# Patient Record
Sex: Female | Born: 1975 | Hispanic: Yes | Marital: Married | Smoking: Never smoker
Health system: Southern US, Community
[De-identification: ages and names within clinical notes are randomized; demographics above are authoritative.]

## PROBLEM LIST (undated history)

## (undated) DIAGNOSIS — J45909 Unspecified asthma, uncomplicated: Secondary | ICD-10-CM

## (undated) DIAGNOSIS — M359 Systemic involvement of connective tissue, unspecified: Secondary | ICD-10-CM

## (undated) DIAGNOSIS — K219 Gastro-esophageal reflux disease without esophagitis: Secondary | ICD-10-CM

## (undated) DIAGNOSIS — K589 Irritable bowel syndrome without diarrhea: Secondary | ICD-10-CM

## (undated) DIAGNOSIS — M5090 Cervical disc disorder, unspecified, unspecified cervical region: Secondary | ICD-10-CM

## (undated) DIAGNOSIS — IMO0001 Reserved for inherently not codable concepts without codable children: Secondary | ICD-10-CM

## (undated) DIAGNOSIS — Z8639 Personal history of other endocrine, nutritional and metabolic disease: Secondary | ICD-10-CM

## (undated) HISTORY — PX: SHOULDER SURGERY: SHX246

## (undated) HISTORY — DX: Unspecified asthma, uncomplicated: J45.909

## (undated) HISTORY — DX: Gastro-esophageal reflux disease without esophagitis: K21.9

## (undated) HISTORY — DX: Cervical disc disorder, unspecified, unspecified cervical region: M50.90

## (undated) HISTORY — DX: Irritable bowel syndrome, unspecified: K58.9

## (undated) HISTORY — PX: SPINE SURGERY: SHX786

## (undated) HISTORY — DX: Personal history of other endocrine, nutritional and metabolic disease: Z86.39

## (undated) HISTORY — DX: Systemic involvement of connective tissue, unspecified: M35.9

## (undated) HISTORY — DX: Reserved for inherently not codable concepts without codable children: IMO0001

---

## 2000-09-23 ENCOUNTER — Other Ambulatory Visit: Admission: RE | Admit: 2000-09-23 | Discharge: 2000-09-23 | Payer: Self-pay | Admitting: Gynecology

## 2001-09-28 ENCOUNTER — Other Ambulatory Visit: Admission: RE | Admit: 2001-09-28 | Discharge: 2001-09-28 | Payer: Self-pay | Admitting: Gynecology

## 2002-10-12 ENCOUNTER — Other Ambulatory Visit: Admission: RE | Admit: 2002-10-12 | Discharge: 2002-10-12 | Payer: Self-pay | Admitting: Gynecology

## 2002-11-26 ENCOUNTER — Ambulatory Visit (HOSPITAL_COMMUNITY): Admission: RE | Admit: 2002-11-26 | Discharge: 2002-11-26 | Payer: Self-pay | Admitting: Gynecology

## 2003-02-25 ENCOUNTER — Observation Stay (HOSPITAL_COMMUNITY): Admission: AD | Admit: 2003-02-25 | Discharge: 2003-02-26 | Payer: Self-pay | Admitting: Gynecology

## 2003-03-21 ENCOUNTER — Other Ambulatory Visit: Admission: RE | Admit: 2003-03-21 | Discharge: 2003-03-21 | Payer: Self-pay | Admitting: Gynecology

## 2003-07-07 ENCOUNTER — Inpatient Hospital Stay (HOSPITAL_COMMUNITY): Admission: AD | Admit: 2003-07-07 | Discharge: 2003-07-07 | Payer: Self-pay | Admitting: Gynecology

## 2003-08-20 ENCOUNTER — Encounter: Admission: RE | Admit: 2003-08-20 | Discharge: 2003-08-20 | Payer: Self-pay | Admitting: Gynecology

## 2003-08-27 ENCOUNTER — Inpatient Hospital Stay (HOSPITAL_COMMUNITY): Admission: AD | Admit: 2003-08-27 | Discharge: 2003-08-31 | Payer: Self-pay | Admitting: Gynecology

## 2003-08-28 ENCOUNTER — Encounter (INDEPENDENT_AMBULATORY_CARE_PROVIDER_SITE_OTHER): Payer: Self-pay | Admitting: *Deleted

## 2003-10-10 ENCOUNTER — Other Ambulatory Visit: Admission: RE | Admit: 2003-10-10 | Discharge: 2003-10-10 | Payer: Self-pay | Admitting: Gynecology

## 2004-12-10 ENCOUNTER — Other Ambulatory Visit: Admission: RE | Admit: 2004-12-10 | Discharge: 2004-12-10 | Payer: Self-pay | Admitting: Gynecology

## 2004-12-18 ENCOUNTER — Encounter: Admission: RE | Admit: 2004-12-18 | Discharge: 2004-12-18 | Payer: Self-pay | Admitting: Gynecology

## 2006-06-28 ENCOUNTER — Other Ambulatory Visit: Admission: RE | Admit: 2006-06-28 | Discharge: 2006-06-28 | Payer: Self-pay | Admitting: Gynecology

## 2007-06-07 ENCOUNTER — Emergency Department (HOSPITAL_COMMUNITY): Admission: EM | Admit: 2007-06-07 | Discharge: 2007-06-07 | Payer: Self-pay | Admitting: Emergency Medicine

## 2007-12-26 ENCOUNTER — Encounter: Admission: RE | Admit: 2007-12-26 | Discharge: 2007-12-26 | Payer: Self-pay | Admitting: Internal Medicine

## 2008-07-31 ENCOUNTER — Encounter: Payer: Self-pay | Admitting: Gynecology

## 2008-07-31 ENCOUNTER — Other Ambulatory Visit: Admission: RE | Admit: 2008-07-31 | Discharge: 2008-07-31 | Payer: Self-pay | Admitting: Gynecology

## 2008-07-31 ENCOUNTER — Ambulatory Visit: Payer: Self-pay | Admitting: Gynecology

## 2008-10-15 ENCOUNTER — Emergency Department (HOSPITAL_COMMUNITY): Admission: EM | Admit: 2008-10-15 | Discharge: 2008-10-16 | Payer: Self-pay | Admitting: Emergency Medicine

## 2009-08-22 ENCOUNTER — Other Ambulatory Visit: Admission: RE | Admit: 2009-08-22 | Discharge: 2009-08-22 | Payer: Self-pay | Admitting: Gynecology

## 2009-08-22 ENCOUNTER — Ambulatory Visit: Payer: Self-pay | Admitting: Gynecology

## 2010-02-24 ENCOUNTER — Emergency Department (HOSPITAL_COMMUNITY)
Admission: EM | Admit: 2010-02-24 | Discharge: 2010-02-24 | Disposition: A | Discharge status: Home / Self Care | Payer: BC Managed Care – PPO | Attending: Emergency Medicine | Admitting: Emergency Medicine

## 2010-02-24 DIAGNOSIS — E162 Hypoglycemia, unspecified: Secondary | ICD-10-CM | POA: Insufficient documentation

## 2010-02-24 DIAGNOSIS — Z79899 Other long term (current) drug therapy: Secondary | ICD-10-CM | POA: Insufficient documentation

## 2010-02-24 DIAGNOSIS — M2550 Pain in unspecified joint: Secondary | ICD-10-CM | POA: Insufficient documentation

## 2010-02-24 DIAGNOSIS — J45909 Unspecified asthma, uncomplicated: Secondary | ICD-10-CM | POA: Insufficient documentation

## 2010-02-24 DIAGNOSIS — K589 Irritable bowel syndrome without diarrhea: Secondary | ICD-10-CM | POA: Insufficient documentation

## 2010-02-24 LAB — GLUCOSE, CAPILLARY: Glucose-Capillary: 105 mg/dL — ABNORMAL HIGH (ref 70–99)

## 2010-03-04 ENCOUNTER — Ambulatory Visit: Payer: BC Managed Care – PPO | Admitting: Gynecology

## 2010-03-04 ENCOUNTER — Other Ambulatory Visit: Payer: BC Managed Care – PPO

## 2010-04-02 ENCOUNTER — Other Ambulatory Visit: Payer: Self-pay | Admitting: Gynecology

## 2010-04-02 ENCOUNTER — Other Ambulatory Visit: Payer: BC Managed Care – PPO

## 2010-04-02 ENCOUNTER — Ambulatory Visit (INDEPENDENT_AMBULATORY_CARE_PROVIDER_SITE_OTHER): Payer: BC Managed Care – PPO | Admitting: Gynecology

## 2010-04-02 DIAGNOSIS — N920 Excessive and frequent menstruation with regular cycle: Secondary | ICD-10-CM

## 2010-04-02 DIAGNOSIS — N946 Dysmenorrhea, unspecified: Secondary | ICD-10-CM

## 2010-04-16 LAB — COMPREHENSIVE METABOLIC PANEL
ALT: 48 U/L — ABNORMAL HIGH (ref 0–35)
AST: 38 U/L — ABNORMAL HIGH (ref 0–37)
Albumin: 4 g/dL (ref 3.5–5.2)
Alkaline Phosphatase: 72 U/L (ref 39–117)
BUN: 10 mg/dL (ref 6–23)
CO2: 21 mEq/L (ref 19–32)
Calcium: 9 mg/dL (ref 8.4–10.5)
Chloride: 107 mEq/L (ref 96–112)
Creatinine, Ser: 0.72 mg/dL (ref 0.4–1.2)
GFR calc Af Amer: >60 mL/min (ref >60)
GFR calc non Af Amer: >60 mL/min (ref >60)
Glucose, Bld: 121 mg/dL — ABNORMAL HIGH (ref 70–99)
Potassium: 3.4 mEq/L — ABNORMAL LOW (ref 3.5–5.1)
Sodium: 138 mEq/L (ref 135–145)
Total Bilirubin: 1.1 mg/dL (ref 0.3–1.2)
Total Protein: 7.1 g/dL (ref 6.0–8.3)

## 2010-04-16 LAB — URINALYSIS, ROUTINE W REFLEX MICROSCOPIC
Glucose, UA: NEGATIVE mg/dL
Ketones, ur: 15 mg/dL — AB
Leukocytes, UA: NEGATIVE
Nitrite: NEGATIVE
Protein, ur: NEGATIVE mg/dL
Specific Gravity, Urine: 1.034 — ABNORMAL HIGH (ref 1.005–1.030)
Urobilinogen, UA: 1 mg/dL (ref 0.0–1.0)
pH: 5 (ref 5.0–8.0)

## 2010-04-16 LAB — URINE MICROSCOPIC-ADD ON

## 2010-04-16 LAB — LIPASE, BLOOD: Lipase: 15 U/L (ref 11–59)

## 2010-04-16 LAB — CBC
HCT: 39.9 % (ref 36.0–46.0)
Hemoglobin: 13.5 g/dL (ref 12.0–15.0)
MCHC: 33.9 g/dL (ref 30.0–36.0)
MCV: 81.8 fL (ref 78.0–100.0)
Platelets: 145 10*3/uL — ABNORMAL LOW (ref 150–400)
RBC: 4.88 MIL/uL (ref 3.87–5.11)
RDW: 13 % (ref 11.5–15.5)
WBC: 7.4 10*3/uL (ref 4.0–10.5)

## 2010-04-16 LAB — POCT PREGNANCY, URINE: Preg Test, Ur: NEGATIVE

## 2010-05-29 NOTE — Discharge Summary (Signed)
NAME:  Lori Mercado, Lori Mercado                          ACCOUNT NO.:  [id number]   MEDICAL RECORD NO.:  [id number]                   PATIENT TYPE:  INP   LOCATION:  9306                                 FACILITY:  [facility]   PHYSICIAN:  Ivor Costa. Farrel Gobble, M.D.              DATE OF BIRTH:  Jan 15, 1975   DATE OF ADMISSION:  08/27/2003  DATE OF DISCHARGE:                                 DISCHARGE SUMMARY   PRINCIPAL DIAGNOSIS:  Twin gestation.   ADDITIONAL DIAGNOSIS:  1. Discordance, approximately 40%.  2. Pulmonary edema.  3. Preterm labor.   PRINCIPAL PROCEDURE:  Primary cesarean section.   HOSPITAL COURSE:  The patient was admitted in the evening of August 27, 2003  after calling the on-call physician complaining of shortness of breath.  She  had been admitted to [facility] for contractions August 14  to August 16 at which point she received IV steroids, IV fluid, as well as  subcu and p.o. terbutaline.  She had presented to our office on the  afternoon of August 16 after being discharged and was noted to have an 11-  pound weight gain and was edematous, felt secondary to the IV fluids.  The  patient called later that evening stating that she continued to feel short  of breath with symptoms that were worse than they were earlier in the day.  She was asked to come to [facility] for evaluation.  The patient was  subjectively uncomfortable, she was unable to lie flat.  She had a normal  pulse oximetry but because of her shortness of breath was placed on oxygen.  The patient was also noted to be contracting although not regularly and was  1-2 cm, 70%, and -3 station.  The patient had a chest x-ray performed which  showed pulmonary edema, and because of the above symptoms underwent a  primary cesarean section on the morning of August 17 with delivery of viable  female twins.  Baby A was 3 pounds 5 ounces with Apgars of 6 and 8 and a pH of  7.29.  Baby B was 4 pounds 10 ounces with  Apgars of 8 and 9 and a pH of  7.32.  The uterus, tubes, and ovaries were unremarkable.  The placenta was  sent to pathology.  Her postoperative course, the patient did essentially  well.  She continued to diurese and her shortness of breath slowly over time  improved.  She had restricted IV fluids because of the pulmonary edema that  she had, as well as the general fluid overload.  She was noted to be anemic  postoperatively with a hemoglobin of 6.4 and hematocrit of 18.8 and  platelets of 117.  These were repeated the following day and were noted to  be stable.  PIH labs done upon admission as well as postpartum day #1 were  both normal.  At the point of postpartum  day #3 the patient was ready for  discharge.  Although she felt dizzy felt secondary to Percocet she was able  to ambulate without difficulty, was visiting the children in the NICU, and  she was pumping, producing little milk at this point.  Her postpartum exam  was unremarkable.  Her uterus was firm and nontender.  Her incision was  clean, dry, and intact.  She was discharged home with instructions to resume  her prenatal vitamins and iron and she was given a prescription for Tylox  one to two q.6h. p.r.n. pain #30 which she may mix with over-the-counter  Motrin.  She will follow up in our office 6 weeks postpartum.   DISCHARGE DISPOSITION:  Stable.                                               Ivor Costa. Farrel Gobble, M.D.   Leda Roys  D:  08/31/2003  T:  08/31/2003  Job:  [id number]

## 2010-05-29 NOTE — Op Note (Signed)
NAMECHESNEE, Lori Mercado                            ACCOUNT NO.:  [id number]   MEDICAL RECORD NO.:  [id number]                   PATIENT TYPE:   LOCATION:                                       FACILITY:   PHYSICIAN:  Ivor Costa. Farrel Gobble, M.D.              DATE OF BIRTH:   DATE OF PROCEDURE:  05/28/2003  DATE OF DISCHARGE:                                 OPERATIVE REPORT   PREOPERATIVE DIAGNOSES:  1. 33+ week twins.  2. Approximately 40% discordance.  3. Maternal pulmonary edema.  4. Preterm labor.   POSTOPERATIVE DIAGNOSES:  1. 33+ week twins.  2. Approximately 40% discordance.  3. Maternal pulmonary edema.  4. Preterm labor.   OPERATION PERFORMED:  Primary cesarean section, low flap transverse.   SURGEON:  Ivor Costa. Farrel Gobble, M.D.   ANESTHESIA:  Spinal.   ESTIMATED BLOOD LOSS:  550 mL.   URINE OUTPUT:  100 mL clear.   FINDINGS:  Viable twin males, vertex-vertex with clear amniotic fluid  bilaterally.  Infant A had Apgars had 6 and 8, birth weight of 3 pounds 5  ounces, with pH of 7.29 and was noted to have a hypercoiled cord.  Infant B  had  Apgars of 8 and 9, birth weight 4 pounds, 10 ounces, pH 7.32.  Normal  uterus, tubes and ovaries.   COMPLICATIONS:  None.   PATHOLOGY:  Placenta.   DESCRIPTION OF PROCEDURE:  The patient was taken to the operating room.  Spinal anesthesia was induced.  Placed in dorsal lithotomy position.  Prepped and draped in the usual sterile fashion.  As the patient was  breathing a little more comfortably at this point we did not need to give  her the Lasix that had been ordered preoperatively.  Once adequate  anesthesia was ensured, a Pfannenstiel skin incision was made with a scalpel  and carried to the underlying layer of fascia with electrocautery.  The  fascia was scored in the midline.  Incision was extended laterally with Mayo  scissors.  The inferior aspect of the fascial incision was grasped with  Kochers.  The underlying rectus muscles  were dissected off by blunt and  sharp dissection.  In a similar fashion, the superior aspect of the incision  was grasped with Kochers.  The underlying rectus muscle was dissected off.  The rectus muscles were separated in the midline, the peritoneum was  identified and entered bluntly.  The peritoneal incision was then extended  superiorly and inferiorly.  Good visualization of underlying bowel and  bladder.  The bladder blade was inserted.  The vesicouterine peritoneum was  identified, tented up and entered sharply with Metzenbaum scissors.  The  incision was extended laterally.  Bladder flap was created digitally.  Bladder blade was then reinserted in the lower uterine segment which was  incised in transverse fashion with a scalpel.  We went to the lower edge of  the  placenta.  Baby A, amniotic fluid was noted.  The vertex was held.  The  amniotomy was performed.  However, the vertex floated up and the baby was  then delivered from a double footling breech presentation with the usual  maneuvers.  Cord was cut and clamped and handed off to the awaiting  pediatricians.  Spontaneous cry was noted.  The sac of baby A was then  visualized.  The head was held and amniotomy was performed.  Baby  ____________ were placed and the baby was delivered with the usual  maneuvers.  The cord was cut and clamped and again, spontaneous respirations  were noted.  PH's were obtained on both placentas.  Cord bloods were also  obtained.  The uterus was massaged and the placentas were separated.  The  uterus was then cleared of all clots and debris.  The uterus was  exteriorized.  As it was noted to be somewhat boggy, the uterine incision  was repaired with a running locked layer of 0 chromic and noted to be  hemostatic.  By the end of the repair of the uterine incision, the bogginess  had resolved.  The uterus was then returned to the pelvis.  The pelvis was  irrigated with copious amounts of warm saline.   Inspection of the lower  segment and incision assured [place] of hemostasis.  The pelvis was cleared of  all clots and debris.  The peritoneum and fascia muscle were noted to be  hemostatic.  The fascia was closed with 0 Vicryl in a running fashion.  The  subcutaneous was irrigated and reapproximated with interrupted 2-0 plain.  The skin was closed with 4-0 Vicryl on the [place].  The patient tolerated the  procedure well.  Sponge, lap, and needle counts were correct times two.  She  received Ancef intraoperatively.                                               Ivor Costa. Farrel Gobble, M.D.    THL/MEDQ  D:  08/28/2003  T:  08/28/2003  Job:  [id number]

## 2010-06-12 HISTORY — PX: ENDOMETRIAL ABLATION: SHX621

## 2010-06-25 ENCOUNTER — Ambulatory Visit (INDEPENDENT_AMBULATORY_CARE_PROVIDER_SITE_OTHER): Payer: BC Managed Care – PPO | Admitting: Gynecology

## 2010-06-25 DIAGNOSIS — Z01818 Encounter for other preprocedural examination: Secondary | ICD-10-CM

## 2010-06-25 DIAGNOSIS — N920 Excessive and frequent menstruation with regular cycle: Secondary | ICD-10-CM

## 2010-06-26 ENCOUNTER — Ambulatory Visit (INDEPENDENT_AMBULATORY_CARE_PROVIDER_SITE_OTHER): Payer: BC Managed Care – PPO | Admitting: Gynecology

## 2010-06-26 DIAGNOSIS — N920 Excessive and frequent menstruation with regular cycle: Secondary | ICD-10-CM

## 2010-07-10 ENCOUNTER — Ambulatory Visit (INDEPENDENT_AMBULATORY_CARE_PROVIDER_SITE_OTHER): Payer: BC Managed Care – PPO | Admitting: Gynecology

## 2010-07-10 DIAGNOSIS — Z9889 Other specified postprocedural states: Secondary | ICD-10-CM

## 2010-07-10 DIAGNOSIS — B3731 Acute candidiasis of vulva and vagina: Secondary | ICD-10-CM

## 2010-07-10 DIAGNOSIS — B373 Candidiasis of vulva and vagina: Secondary | ICD-10-CM

## 2010-07-17 ENCOUNTER — Encounter: Payer: Self-pay | Admitting: *Deleted

## 2010-10-01 IMAGING — CT CT ABDOMEN W/O CM
2 of 4 series · 17 of 46 positions shown, 19 images · non-contrast
Comparison: None available.

CT ABDOMEN

CLINICAL DATA: Hematuria. Nausea and low back pain.

CT ABDOMEN AND PELVIS WITHOUT CONTRAST
TECHNIQUE: Multidetector CT imaging of the abdomen and pelvis was
performed following the standard protocol without intravenous
contrast.

[Series 2: renal stone w/o · axial · non-contrast · 0.66mm/px · z∈[-339,-4]mm · 14 of 73 slices shown, 16 images]
[im 3/73  soft-tissue]
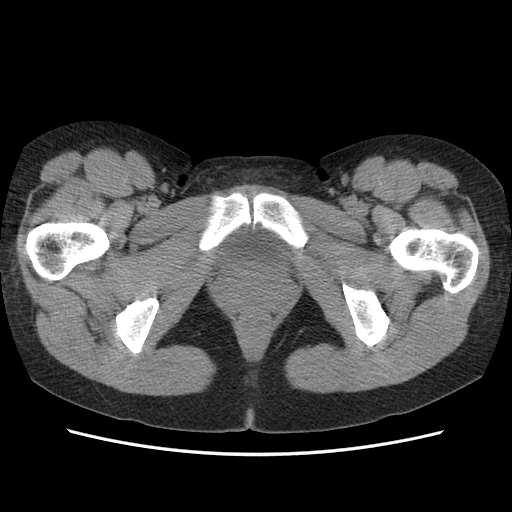
[im 3/73  bone]
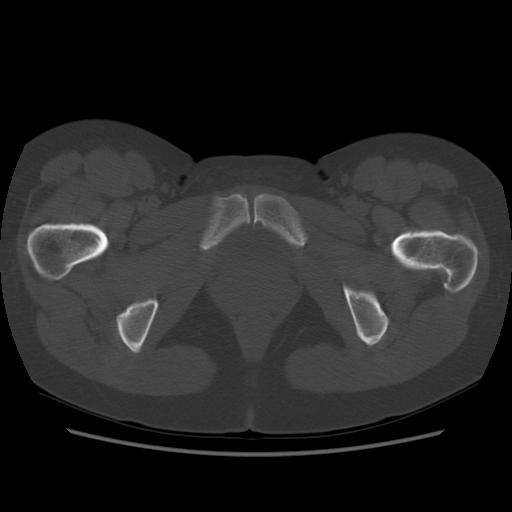
[im 9/73  soft-tissue]
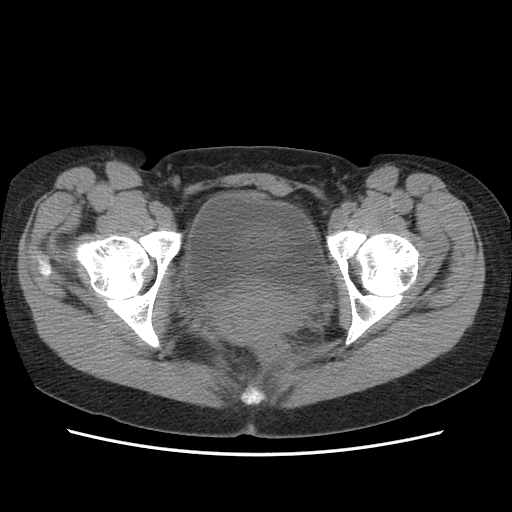
[im 15/73  soft-tissue]
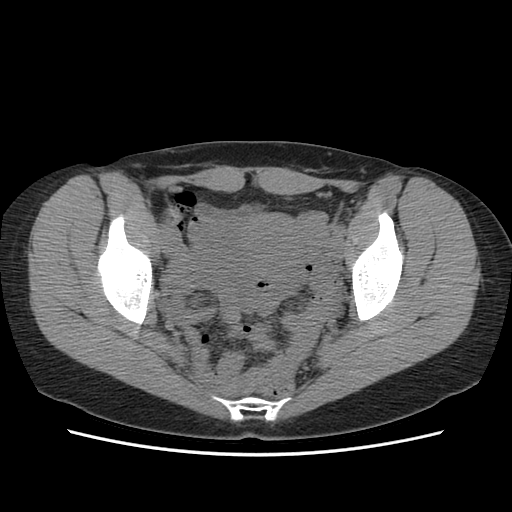
[im 21/73  soft-tissue]
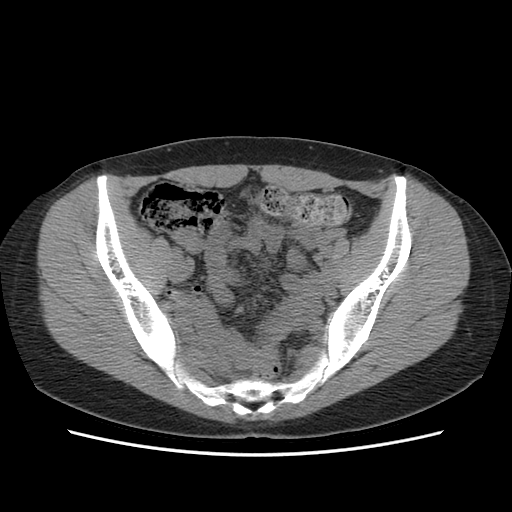
[im 24/73  soft-tissue]
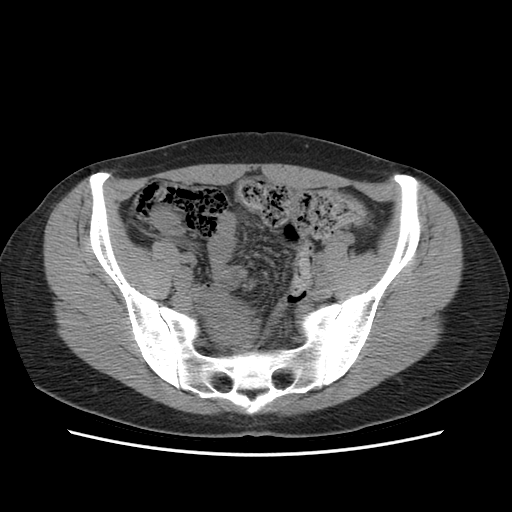
[im 29/73  soft-tissue]
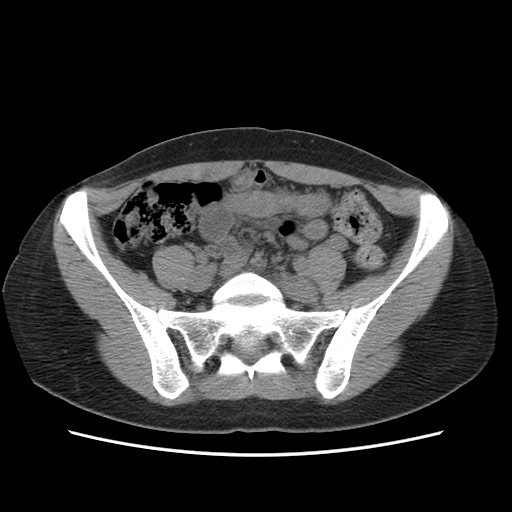
[im 35/73  soft-tissue]
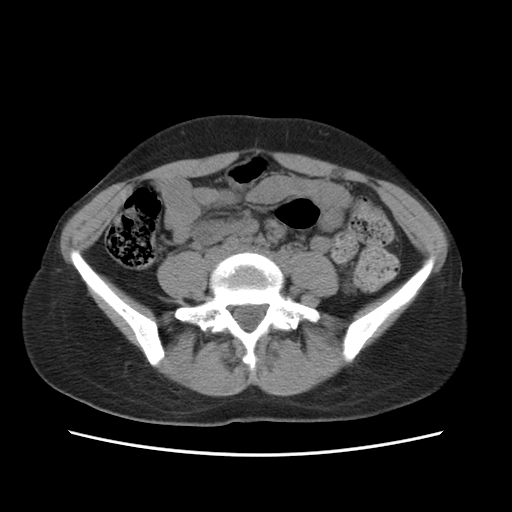
[im 38/73  soft-tissue]
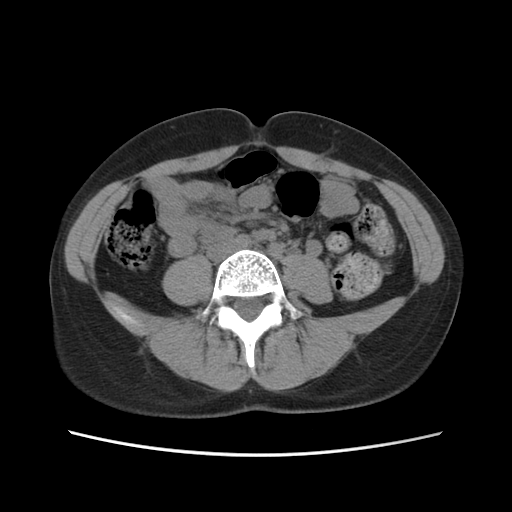
[im 44/73  soft-tissue]
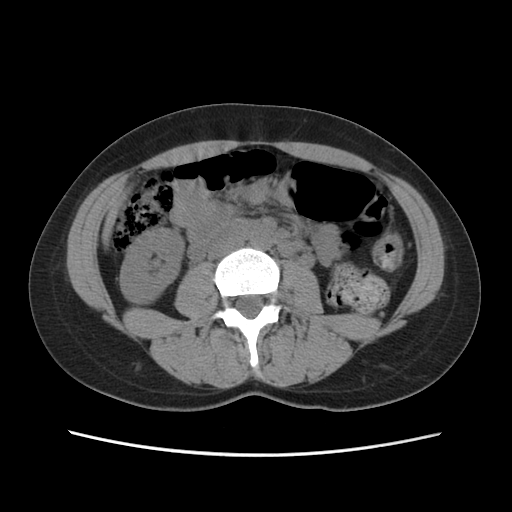
[im 44/73  bone]
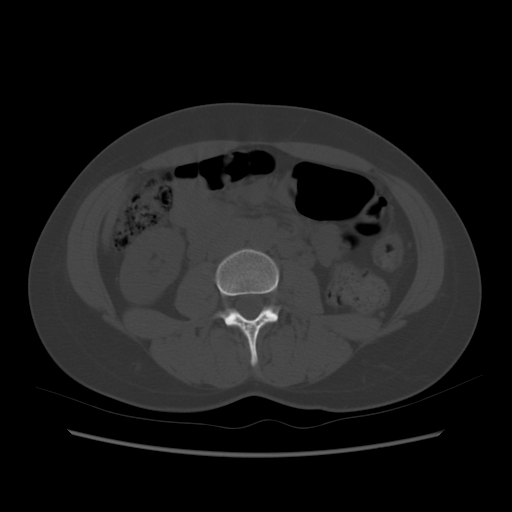
[im 49/73  soft-tissue]
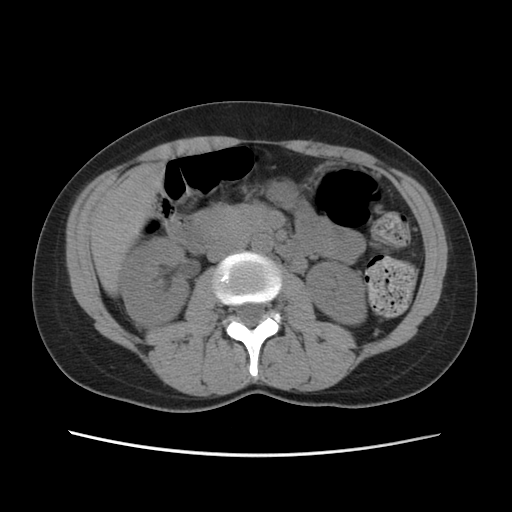
[im 55/73  soft-tissue]
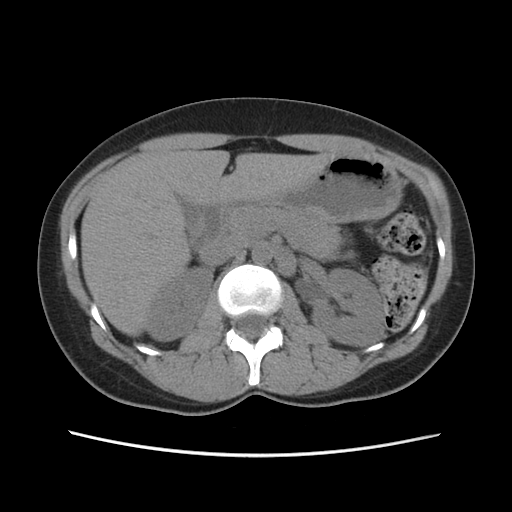
[im 58/73  soft-tissue]
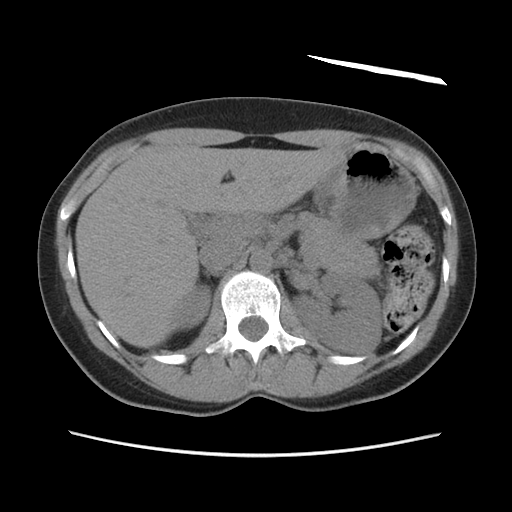
[im 64/73  soft-tissue]
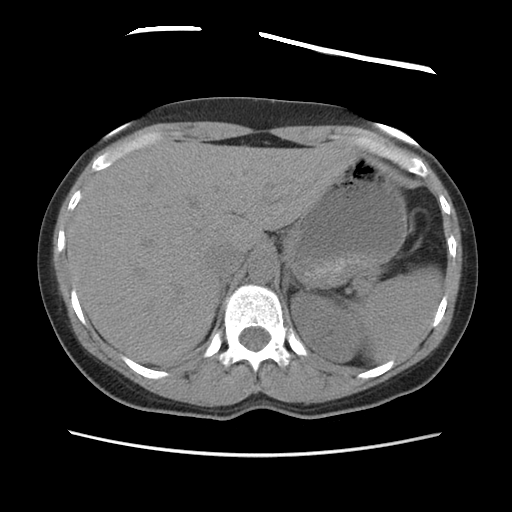
[im 70/73  soft-tissue]
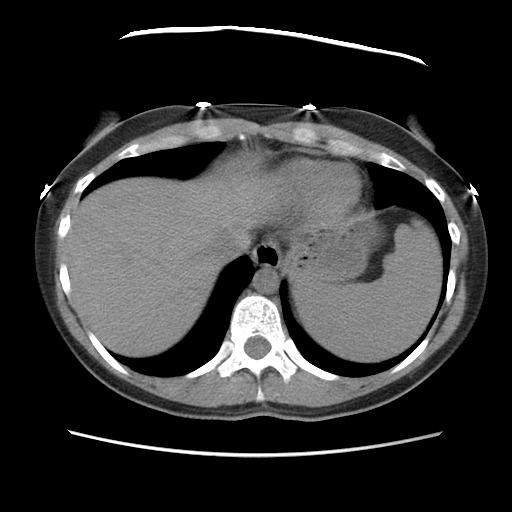

[Series 400: cor · coronal · 0.81mm/px · 3 of 92 slices shown]
[im 31/92  soft-tissue]
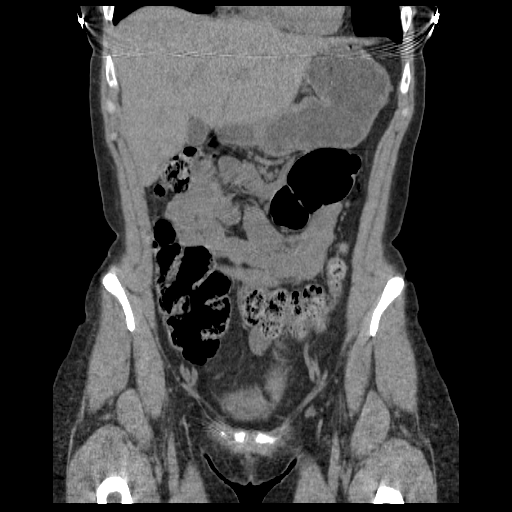
[im 41/92  soft-tissue]
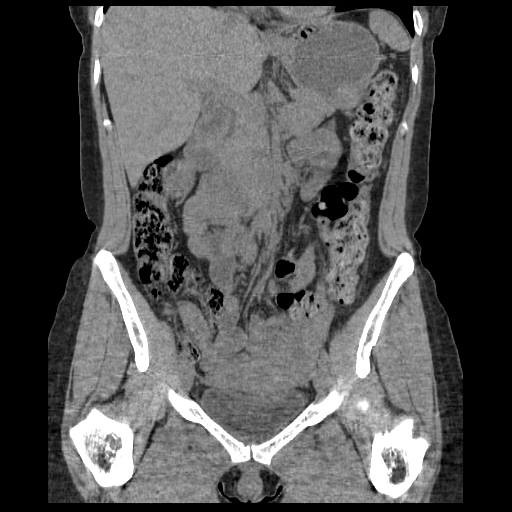
[im 51/92  soft-tissue]
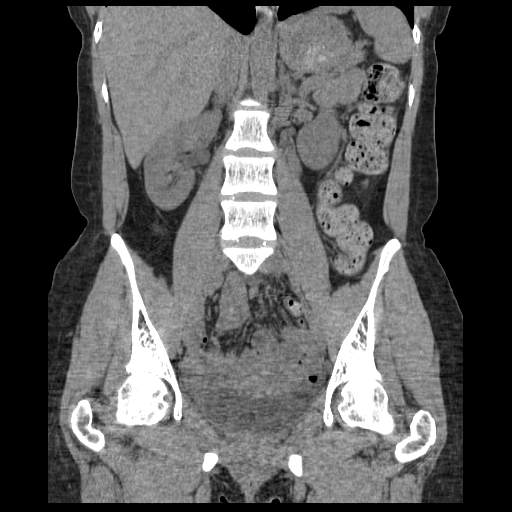

[17 of 46 positions shown; findings below may reference images not displayed]

FINDINGS: Imaged lung parynchema is clear. No pleural effusion.

 The kidneys appear normal without stone or hydronephrosis. No
ureteral stone. The liver, spleen, gallbladder, pancreas and
adrenal glands all have a normal uninfused appearance.  Stomach
small bowel appear normal.  There is no abdominal lymphadenopathy
or fluid.  No focal bony abnormality.
IMPRESSION: Negative abdomen CT scan.  No urinary tract stones.

CT PELVIS
FINDINGS: The uterus and adnexa and urinary bladder all appear
normal.  There is no pelvic fluid or lymphadenopathy.  The colon
and appendix are normal in appearance.  No focal bony abnormality.
IMPRESSION: Negative pelvic CT.

## 2010-10-07 LAB — DIFFERENTIAL
Basophils Absolute: 0
Basophils Relative: 0
Eosinophils Absolute: 0.2
Eosinophils Relative: 3
Lymphocytes Relative: 21
Lymphs Abs: 1.3
Monocytes Absolute: 0.5
Monocytes Relative: 8
Neutro Abs: 4.1
Neutrophils Relative %: 68

## 2010-10-07 LAB — POCT I-STAT, CHEM 8
BUN: 10
Calcium, Ion: 1.21
Chloride: 104
Creatinine, Ser: 0.9
Glucose, Bld: 98
HCT: 41
Hemoglobin: 13.9
Potassium: 3.8
Sodium: 138
TCO2: 26

## 2010-10-07 LAB — URINE CULTURE: Colony Count: 40000

## 2010-10-07 LAB — CBC
HCT: 40.2
Hemoglobin: 13.5
MCHC: 33.6
MCV: 80.6
Platelets: 183
RBC: 4.99
RDW: 13.5
WBC: 6.1

## 2010-10-07 LAB — URINALYSIS, ROUTINE W REFLEX MICROSCOPIC
Glucose, UA: NEGATIVE
Hgb urine dipstick: NEGATIVE
Ketones, ur: NEGATIVE
Nitrite: NEGATIVE
Protein, ur: NEGATIVE
Specific Gravity, Urine: 1.019

## 2010-10-07 LAB — URINE MICROSCOPIC-ADD ON

## 2010-10-07 LAB — POCT PREGNANCY, URINE
Operator id: 196461
Preg Test, Ur: NEGATIVE

## 2011-02-22 ENCOUNTER — Other Ambulatory Visit: Payer: Self-pay | Admitting: Internal Medicine

## 2011-02-22 DIAGNOSIS — M542 Cervicalgia: Secondary | ICD-10-CM

## 2011-02-26 ENCOUNTER — Ambulatory Visit
Admission: RE | Admit: 2011-02-26 | Discharge: 2011-02-26 | Disposition: A | Discharge status: Home / Self Care | Payer: BC Managed Care – PPO | Source: Ambulatory Visit | Attending: Internal Medicine | Admitting: Internal Medicine

## 2011-02-26 DIAGNOSIS — M542 Cervicalgia: Secondary | ICD-10-CM

## 2011-12-07 ENCOUNTER — Other Ambulatory Visit: Payer: Self-pay | Admitting: Internal Medicine

## 2011-12-07 DIAGNOSIS — R4789 Other speech disturbances: Secondary | ICD-10-CM

## 2011-12-07 DIAGNOSIS — R413 Other amnesia: Secondary | ICD-10-CM

## 2011-12-07 DIAGNOSIS — R209 Unspecified disturbances of skin sensation: Secondary | ICD-10-CM

## 2011-12-07 DIAGNOSIS — R259 Unspecified abnormal involuntary movements: Secondary | ICD-10-CM

## 2011-12-13 ENCOUNTER — Ambulatory Visit
Admission: RE | Admit: 2011-12-13 | Discharge: 2011-12-13 | Disposition: A | Discharge status: Home / Self Care | Payer: BC Managed Care – PPO | Source: Ambulatory Visit | Attending: Internal Medicine | Admitting: Internal Medicine

## 2011-12-13 DIAGNOSIS — R209 Unspecified disturbances of skin sensation: Secondary | ICD-10-CM

## 2011-12-13 DIAGNOSIS — R4789 Other speech disturbances: Secondary | ICD-10-CM

## 2011-12-13 DIAGNOSIS — R259 Unspecified abnormal involuntary movements: Secondary | ICD-10-CM

## 2011-12-13 DIAGNOSIS — R413 Other amnesia: Secondary | ICD-10-CM

## 2011-12-13 MED ORDER — GADOBENATE DIMEGLUMINE 529 MG/ML IV SOLN
14.0000 mL | Freq: Once | INTRAVENOUS | Status: AC | PRN
  Administered 2011-12-13: 14 mL via INTRAVENOUS

## 2012-01-14 DIAGNOSIS — R209 Unspecified disturbances of skin sensation: Secondary | ICD-10-CM | POA: Insufficient documentation

## 2012-05-03 ENCOUNTER — Encounter: Payer: Self-pay | Admitting: Neurology

## 2012-05-03 DIAGNOSIS — R209 Unspecified disturbances of skin sensation: Secondary | ICD-10-CM

## 2012-05-04 ENCOUNTER — Ambulatory Visit: Payer: Self-pay | Admitting: Neurology

## 2012-07-04 ENCOUNTER — Encounter (HOSPITAL_COMMUNITY): Payer: Self-pay | Admitting: Radiology

## 2012-07-04 ENCOUNTER — Emergency Department (HOSPITAL_COMMUNITY)
Admission: EM | Admit: 2012-07-04 | Discharge: 2012-07-04 | Disposition: A | Discharge status: Home / Self Care | Payer: BC Managed Care – PPO | Attending: Emergency Medicine | Admitting: Emergency Medicine

## 2012-07-04 ENCOUNTER — Emergency Department (HOSPITAL_COMMUNITY): Payer: BC Managed Care – PPO

## 2012-07-04 DIAGNOSIS — R079 Chest pain, unspecified: Secondary | ICD-10-CM | POA: Insufficient documentation

## 2012-07-04 DIAGNOSIS — F411 Generalized anxiety disorder: Secondary | ICD-10-CM | POA: Insufficient documentation

## 2012-07-04 DIAGNOSIS — Z8719 Personal history of other diseases of the digestive system: Secondary | ICD-10-CM | POA: Insufficient documentation

## 2012-07-04 DIAGNOSIS — R209 Unspecified disturbances of skin sensation: Secondary | ICD-10-CM | POA: Insufficient documentation

## 2012-07-04 DIAGNOSIS — J45909 Unspecified asthma, uncomplicated: Secondary | ICD-10-CM | POA: Insufficient documentation

## 2012-07-04 DIAGNOSIS — R002 Palpitations: Secondary | ICD-10-CM | POA: Insufficient documentation

## 2012-07-04 DIAGNOSIS — Z8739 Personal history of other diseases of the musculoskeletal system and connective tissue: Secondary | ICD-10-CM | POA: Insufficient documentation

## 2012-07-04 DIAGNOSIS — Z8679 Personal history of other diseases of the circulatory system: Secondary | ICD-10-CM | POA: Insufficient documentation

## 2012-07-04 DIAGNOSIS — Z79899 Other long term (current) drug therapy: Secondary | ICD-10-CM | POA: Insufficient documentation

## 2012-07-04 DIAGNOSIS — R29810 Facial weakness: Secondary | ICD-10-CM | POA: Diagnosis present

## 2012-07-04 LAB — POCT I-STAT, CHEM 8
Calcium, Ion: 1.14 mmol/L (ref 1.12–1.23)
Glucose, Bld: 99 mg/dL (ref 70–99)
HCT: 42 % (ref 36.0–46.0)
Hemoglobin: 14.3 g/dL (ref 12.0–15.0)
TCO2: 21 mmol/L (ref 0–100)

## 2012-07-04 LAB — COMPREHENSIVE METABOLIC PANEL
ALT: 38 U/L — ABNORMAL HIGH (ref 0–35)
AST: 26 U/L (ref 0–37)
Albumin: 3.9 g/dL (ref 3.5–5.2)
Calcium: 9.2 mg/dL (ref 8.4–10.5)
GFR calc Af Amer: >90 mL/min (ref >90)
Glucose, Bld: 95 mg/dL (ref 70–99)
Sodium: 136 mEq/L (ref 135–145)
Total Protein: 7.1 g/dL (ref 6.0–8.3)

## 2012-07-04 LAB — PROTIME-INR
INR: 0.9 (ref 0.00–1.49)
Prothrombin Time: 12.1 seconds (ref 11.6–15.2)

## 2012-07-04 LAB — GLUCOSE, CAPILLARY: Glucose-Capillary: 107 mg/dL — ABNORMAL HIGH (ref 70–99)

## 2012-07-04 LAB — DIFFERENTIAL
Basophils Absolute: 0 10*3/uL (ref 0.0–0.1)
Basophils Relative: 0 % (ref 0–1)
Eosinophils Absolute: 0.2 10*3/uL (ref 0.0–0.7)
Eosinophils Relative: 2 % (ref 0–5)
Neutrophils Relative %: 60 % (ref 43–77)

## 2012-07-04 LAB — CBC
MCH: 26.7 pg (ref 26.0–34.0)
MCHC: 34.3 g/dL (ref 30.0–36.0)
MCV: 78 fL (ref 78.0–100.0)
Platelets: 170 10*3/uL (ref 150–400)
RDW: 13.4 % (ref 11.5–15.5)

## 2012-07-04 LAB — POCT I-STAT TROPONIN I: Troponin i, poc: 0.01 ng/mL (ref 0.00–0.08)

## 2012-07-04 MED ORDER — LORAZEPAM 1 MG PO TABS
1.0000 mg | ORAL_TABLET | Freq: Once | ORAL | Status: AC
  Administered 2012-07-04: 1 mg via ORAL
  Filled 2012-07-04: qty 1

## 2012-07-04 MED ORDER — ONDANSETRON HCL 4 MG/2ML IJ SOLN
4.0000 mg | Freq: Once | INTRAMUSCULAR | Status: AC
  Administered 2012-07-04: 4 mg via INTRAVENOUS
  Filled 2012-07-04: qty 2

## 2012-07-04 MED ORDER — POTASSIUM CHLORIDE CRYS ER 20 MEQ PO TBCR
40.0000 meq | EXTENDED_RELEASE_TABLET | Freq: Once | ORAL | Status: AC
  Administered 2012-07-04: 40 meq via ORAL
  Filled 2012-07-04: qty 2

## 2012-07-04 MED ORDER — HYDROMORPHONE HCL PF 1 MG/ML IJ SOLN
1.0000 mg | Freq: Once | INTRAMUSCULAR | Status: AC
  Administered 2012-07-04: 1 mg via INTRAVENOUS
  Filled 2012-07-04: qty 1

## 2012-07-04 NOTE — ED Notes (Signed)
Pt states that her lips are no longer numb.

## 2012-07-04 NOTE — ED Notes (Signed)
Pt states that there has been no change in her chest tightness after receiving the Ativan.

## 2012-07-04 NOTE — ED Notes (Addendum)
Pt states that her chest pain has lessened with the Dilaudid, but she has occasional break through pain, which she describes as "stabbing". Pt also reports "dime-sized" bruises on her inner thighs, that she states have no known cause.

## 2012-07-04 NOTE — Consult Note (Signed)
NEURO HOSPITALIST CONSULT NOTE    Reason for Consult: Acute left facial droop.  HPI:                                                                                                                                          Lori Mercado is an 37 y.o. 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 He was also an equivocal weakness of hand grip on the left. Code stroke was initiated. CT scan of the head showed no acute intracranial abnormality. As the previous history of stroke nor TIA. Patient has not been on antiplatelet therapy. Facial asymmetry was felt to be voluntary and functional. Code stroke was subsequently canceled.  Past Medical History  Diagnosis Date  . IBS (irritable bowel syndrome)   . Reflux   . Migraine   . Asthma   . 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 Cesarean section  2005    twins  . Endometrial ablation  06/2010    her option    Family History  Problem Relation Age of Onset  . Hypertension Father   . Arthritis Mother   . Multiple sclerosis Maternal Aunt   . Osteoarthritis Maternal Grandmother   . Heart attack Maternal Grandfather   . Stroke Paternal Grandmother   . Stroke Paternal Grandfather   . Lupus Cousin     Social History:  reports that she has never smoked. She does not have any smokeless tobacco history on file. She reports that  drinks alcohol. She reports that she does not use illicit drugs.  Allergies  Allergen Reactions  . Aspirin     MEDICATIONS:                                                                                                                     I have reviewed the patient's current medications. Prior to Admission:  Flexeril 10 mg when necessary  for muscle spasms Plaquenil 200 mg per day Ibuprofen as needed for pain Meloxicam 7.5 mg daily Pamelor  10 mg 3 times a day  ROS:                                                                                                                                       History obtained from spouse and the patient  General ROS: negative for - chills, fatigue, fever, night sweats, weight gain or weight loss Psychological ROS: negative for - behavioral disorder, hallucinations, memory difficulties, mood swings or suicidal ideation Ophthalmic ROS: negative for - blurry vision, double vision, eye pain or loss of vision ENT ROS: negative for - epistaxis, nasal discharge, oral lesions, sore throat, tinnitus or vertigo Allergy and Immunology ROS: negative for - hives or itchy/watery eyes Hematological and Lymphatic ROS: negative for - bleeding problems, bruising or swollen lymph nodes Endocrine ROS: negative for - galactorrhea, hair pattern changes, polydipsia/polyuria or temperature intolerance Respiratory ROS: negative for - cough, hemoptysis, shortness of breath or wheezing Cardiovascular ROS: negative for - chest pain, dyspnea on exertion, edema or irregular heartbeat Gastrointestinal ROS: negative for - abdominal pain, diarrhea, hematemesis, nausea/vomiting or stool incontinence Genito-Urinary ROS: negative for - dysuria, hematuria, incontinence or urinary frequency/urgency Musculoskeletal ROS: negative for - joint swelling or muscular weakness Neurological ROS: as noted in HPI Dermatological ROS: negative for rash and skin lesion changes   Blood pressure 145/89, pulse 98, resp. rate 16, last menstrual period 06/07/2012, SpO2 100.00%.   Neurologic Examination:                                                                                                      Mental Status: Alert, oriented, fairly anxious.  Speech fluent without evidence of aphasia. Able to follow commands without  difficulty. Cranial Nerves: II-Visual fields were normal. III/IV/VI-Pupils were equal and reacted. Extraocular movements were full and conjugate.    V/VII-no facial numbness; no objective signs facial weakness; mouth was held tonically toward left side. VIII-normal. X-normal speech with no dysarthria. XII-midline tongue extension Motor: 5/5 bilaterally with normal tone and bulk Sensory: Normal throughout. Deep Tendon Reflexes: 2+ and symmetric. Plantars: Flexor bilaterally Cerebellar: Normal finger-to-nose testing. Carotid auscultation: Normal  No results found for this basename: cbc, bmp, coags, chol, tri, ldl, hga1c    Results for orders placed during the hospital encounter of 07/04/12 (from the past 48 hour(s))  GLUCOSE, CAPILLARY     Status: Abnormal   Collection Time    07/04/12  2:31 AM      Result Value Range   Glucose-Capillary 107 (*)  70 - 99 mg/dL  PROTIME-INR     Status: None   Collection Time    07/04/12  2:45 AM      Result Value Range   Prothrombin Time 12.1  11.6 - 15.2 seconds   INR 0.90  0.00 - 1.49  APTT     Status: None   Collection Time    07/04/12  2:45 AM      Result Value Range   aPTT 29  24 - 37 seconds  CBC     Status: Abnormal   Collection Time    07/04/12  2:45 AM      Result Value Range   WBC 6.6  4.0 - 10.5 K/uL   RBC 5.13 (*) 3.87 - 5.11 MIL/uL   Hemoglobin 13.7  12.0 - 15.0 g/dL   HCT 16.1  09.6 - 04.5 %   MCV 78.0  78.0 - 100.0 fL   MCH 26.7  26.0 - 34.0 pg   MCHC 34.3  30.0 - 36.0 g/dL   RDW 40.9  81.1 - 91.4 %   Platelets 170  150 - 400 K/uL  DIFFERENTIAL     Status: None   Collection Time    07/04/12  2:45 AM      Result Value Range   Neutrophils Relative % 60  43 - 77 %   Neutro Abs 3.9  1.7 - 7.7 K/uL   Lymphocytes Relative 29  12 - 46 %   Lymphs Abs 1.9  0.7 - 4.0 K/uL   Monocytes Relative 9  3 - 12 %   Monocytes Absolute 0.6  0.1 - 1.0 K/uL   Eosinophils Relative 2  0 - 5 %   Eosinophils Absolute 0.2  0.0 - 0.7 K/uL    Basophils Relative 0  0 - 1 %   Basophils Absolute 0.0  0.0 - 0.1 K/uL  COMPREHENSIVE METABOLIC PANEL     Status: Abnormal   Collection Time    07/04/12  2:45 AM      Result Value Range   Sodium 136  135 - 145 mEq/L   Potassium 3.3 (*) 3.5 - 5.1 mEq/L   Chloride 102  96 - 112 mEq/L   CO2 23  19 - 32 mEq/L   Glucose, Bld 95  70 - 99 mg/dL   BUN 15  6 - 23 mg/dL   Creatinine, Ser 7.82  0.50 - 1.10 mg/dL   Calcium 9.2  8.4 - 95.6 mg/dL   Total Protein 7.1  6.0 - 8.3 g/dL   Albumin 3.9  3.5 - 5.2 g/dL   AST 26  0 - 37 U/L   ALT 38 (*) 0 - 35 U/L   Alkaline Phosphatase 73  39 - 117 U/L   Total Bilirubin 0.3  0.3 - 1.2 mg/dL   GFR calc non Af Amer >90  >90 mL/min   GFR calc Af Amer >90  >90 mL/min   Comment:            The eGFR has been calculated     using the CKD EPI equation.     This calculation has not been     validated in all clinical     situations.     eGFR's persistently     <90 mL/min signify     possible Chronic Kidney Disease.  TROPONIN I     Status: None   Collection Time    07/04/12  2:45 AM      Result  Value Range   Troponin I <0.30  <0.30 ng/mL   Comment:            Due to the release kinetics of cTnI,     a negative result within the first hours     of the onset of symptoms does not rule out     myocardial infarction with certainty.     If myocardial infarction is still suspected,     repeat the test at appropriate intervals.  POCT I-STAT TROPONIN I     Status: None   Collection Time    07/04/12  2:45 AM      Result Value Range   Troponin i, poc 0.01  0.00 - 0.08 ng/mL   Comment 3            Comment: Due to the release kinetics of cTnI,     a negative result within the first hours     of the onset of symptoms does not rule out     myocardial infarction with certainty.     If myocardial infarction is still suspected,     repeat the test at appropriate intervals.  POCT I-STAT, CHEM 8     Status: Abnormal   Collection Time    07/04/12  2:47 AM       Result Value Range   Sodium 140  135 - 145 mEq/L   Potassium 3.2 (*) 3.5 - 5.1 mEq/L   Chloride 106  96 - 112 mEq/L   BUN 15  6 - 23 mg/dL   Creatinine, Ser 1.61  0.50 - 1.10 mg/dL   Glucose, Bld 99  70 - 99 mg/dL   Calcium, Ion 0.96  0.45 - 1.23 mmol/L   TCO2 21  0 - 100 mmol/L   Hemoglobin 14.3  12.0 - 15.0 g/dL   HCT 40.9  81.1 - 91.4 %    Ct Head (brain) Wo Contrast  07/04/2012   *RADIOLOGY REPORT*  Clinical Data: Code stroke.  Chest pain, left facial droop.  CT HEAD WITHOUT CONTRAST  Technique:  Contiguous axial images were obtained from the base of the skull through the vertex without contrast.  Comparison: MRI 12/13/2011  Findings: No acute intracranial abnormality.  Specifically, no hemorrhage, hydrocephalus, mass lesion, acute infarction, or significant intracranial injury.  No acute calvarial abnormality. Visualized paranasal sinuses and mastoids clear.  Orbital soft tissues unremarkable.  IMPRESSION: Negative exam.  Critical Value/emergent results were called by telephone at the time of interpretation on 07/04/2012 at 2:47 a.m. to Dr. Dierdre Highman, who verbally acknowledged these results.   Original Report Authenticated By: Charlett Nose, M.D.   Assessment/Plan: No objective clinical signs of acute stroke or TIA. Facial asymmetry his functional.  No further neurological intervention is indicated. Consider anxiety lytic agent and/or psychiatry consultation if symptoms persist.  Venetia Maxon M.D. [facility] [phone]  07/04/2012, 3:59 AM

## 2012-07-04 NOTE — ED Notes (Signed)
Opitz MD at bedside. 

## 2012-07-04 NOTE — ED Notes (Signed)
Ambulated pt with stand by assist, pt stated she felt dizzy while walking. But was feeling a lot better.

## 2012-07-04 NOTE — ED Notes (Signed)
Pt reports Left side chest pain, SOB, and palpitations starting approx 2200. Pt c/o numbness, tingling to Left arm/leg, Left side facial droop, Left arm weakness

## 2012-07-04 NOTE — ED Notes (Signed)
Pt states that she was on her way to the ER with her husband because she was having chest pain and heart palpitations. Pt reports that she started feeling numbness and weakness on her left side as she was walking into the hospital.

## 2012-07-04 NOTE — ED Notes (Signed)
Patient discharged to home with family. NAD.  

## 2012-07-04 NOTE — Code Documentation (Signed)
37 yo bf brought in via pvt vehicle with sudden onset facial droop & weakness.  Per pt she had chest pain earlier in the day & was SOB.  As she was on her way to the hospital she developed Lt facial droop & bil weakness.  Nih 1 for facial droop.  Code stroke called 0233, pt arrival 0223, LKW 0220, EDP exam 0233, stroke team arrival 0235, pt arrival in CT 0234, phlebotomist arrival 0235, code stroke cancelled 0255 secondary to no objective deficits.

## 2012-07-04 NOTE — ED Provider Notes (Signed)
History    CSN: [id number] Arrival date & time 07/04/12  0223  First MD Initiated Contact with Patient 07/04/12 0344     Chief Complaint  Patient presents with  . Code Stroke   (Consider location/radiation/quality/duration/timing/severity/associated sxs/prior Treatment) HPI Hx per PT - palpitations on and off for the last month, just turned in her Holter monitor to her PCP today.  Coming ot the ER tonight for recurrent persistent palpitations now with sharp L sided CPs. On arrival to the ED developed L facial weakness/ numbness and code stroke was called.  PT has h/o migraines. No HA tonight, is very anxious. HEr CP stated tonght. She is able to point with one finger where it is located left anterior chest wall.  No radiation of pain, no associated SOB, cough, fever, chills, leg pain or swelling.   Past Medical History  Diagnosis Date  . IBS (irritable bowel syndrome)   . Reflux   . Migraine   . Asthma   . 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 Cesarean section  2005    twins  . Endometrial ablation  06/2010    her option   Family History  Problem Relation Age of Onset  . Hypertension Father   . Arthritis Mother   . Multiple sclerosis Maternal Aunt   . Osteoarthritis Maternal Grandmother   . Heart attack Maternal Grandfather   . Stroke Paternal Grandmother   . Stroke Paternal Grandfather   . Lupus Cousin    History  Substance Use Topics  . Smoking status: Never Smoker   . Smokeless tobacco: Not on file  . Alcohol Use: Yes     Comment: Drinks wine on occasion   OB History   Grav Para Term Preterm Abortions TAB SAB Ect Mult Living                 Review of Systems  Constitutional: Negative for fever and chills.  HENT: Negative for neck pain and neck stiffness.   Eyes: Negative for pain.  Respiratory: Negative for shortness of breath.    Cardiovascular: Positive for chest pain and palpitations. Negative for leg swelling.  Gastrointestinal: Negative for abdominal pain.  Genitourinary: Negative for dysuria.  Musculoskeletal: Negative for back pain.  Skin: Negative for rash.  Neurological: Positive for facial asymmetry. Negative for syncope, speech difficulty and headaches.  All other systems reviewed and are negative.    Allergies  Aspirin  Home Medications   Current Outpatient Rx  Name  Route  Sig  Dispense  Refill  . cyclobenzaprine (FLEXERIL) 10 MG tablet   Oral   Take 10 mg by mouth as needed for muscle spasms.         . hydroxychloroquine (PLAQUENIL) 200 MG tablet   Oral   Take by mouth daily.           . Ibuprofen (MOTRIN IB PO)   Oral   Take by mouth as needed.           . meloxicam (MOBIC) 7.5 MG tablet   Oral   Take 7.5 mg by mouth daily.         . nortriptyline (PAMELOR) 10 MG capsule   Oral   Take 10 mg by mouth 3 (three) times daily.          BP 145/89  Pulse 98  Resp 16  SpO2 100%  LMP 06/07/2012 Physical Exam  Constitutional: She is oriented to person, place, and time. She appears well-developed and well-nourished.  HENT:  Head: Normocephalic and atraumatic.  Eyes: EOM are normal. Pupils are equal, round, and reactive to light.  Neck: Neck supple.  Cardiovascular: Normal rate, regular rhythm and intact distal pulses.   Pulmonary/Chest: Effort normal and breath sounds normal. No respiratory distress. She exhibits no tenderness.  No rash or crepitus.  Abdominal: Soft. She exhibits no distension. There is no tenderness.  Musculoskeletal: Normal range of motion. She exhibits no edema.  Neurological: She is alert and oriented to person, place, and time.  Left facial muscle tone appreciated with ?spasm and asymmetry. All other CNs 2-12 intact without sensory deficit. Equal UE/ LE strengths, sensorium, DTRs, no pronator drift.   Skin: Skin is warm and dry.  Psychiatric:   anxious    ED Course  Procedures (including critical care time) Labs Reviewed  CBC - Abnormal; Notable for the following:    RBC 5.13 (*)    All other components within normal limits  COMPREHENSIVE METABOLIC PANEL - Abnormal; Notable for the following:    Potassium 3.3 (*)    ALT 38 (*)    All other components within normal limits  GLUCOSE, CAPILLARY - Abnormal; Notable for the following:    Glucose-Capillary 107 (*)    All other components within normal limits  POCT I-STAT, CHEM 8 - Abnormal; Notable for the following:    Potassium 3.2 (*)    All other components within normal limits  PROTIME-INR  APTT  DIFFERENTIAL  TROPONIN I  POCT I-STAT TROPONIN I   Ct Head (brain) Wo Contrast  07/04/2012   *RADIOLOGY REPORT*  Clinical Data: Code stroke.  Chest pain, left facial droop.  CT HEAD WITHOUT CONTRAST  Technique:  Contiguous axial images were obtained from the base of the skull through the vertex without contrast.  Comparison: MRI 12/13/2011  Findings: No acute intracranial abnormality.  Specifically, no hemorrhage, hydrocephalus, mass lesion, acute infarction, or significant intracranial injury.  No acute calvarial abnormality. Visualized paranasal sinuses and mastoids clear.  Orbital soft tissues unremarkable.  IMPRESSION: Negative exam.  Critical Value/emergent results were called by telephone at the time of interpretation on 07/04/2012 at 2:47 a.m. to Dr. Dierdre Highman, who verbally acknowledged these results.   Original Report Authenticated By: Charlett Nose, M.D.    Date: 07/04/2012  Rate: 81  Rhythm: normal sinus rhythm  QRS Axis: normal  Intervals: normal  ST/T Wave abnormalities: nonspecific ST changes  Conduction Disutrbances:none  Narrative Interpretation:   Old EKG Reviewed: none available  Code stroke called in triage - evaluated by NEU Dr Roseanne Reno - code stroke canceled.  PT given pain medications and PO ativan, potassium for hypokalemia.   Recheck at 8 am, she is feeling  much better, palpitations resolved. She ambulates to the bathroom NAD.  She tolertaes PO fluids. She has scheduled f/u PCP for Holter monitor results.  She agrees to discuss with her physician repeat blood work to evaluate hypokalemia and for now will increase potassium in her diet.   Stable for d/c home with strict return precautions verbalized as understood.   MDM  Palpitations, atypical CP and left facial spasm. Evaluated initially as code stroke which was canceled after neurology evaluated patient.    ECG, serial troponins, labs and imaging as above. Electrolyte abnormality adressed  Improved with medications  Serial evaluations  VS, and nurses notes reviewed and considered  Sunnie Nielsen, MD 07/04/12 2030

## 2012-09-11 ENCOUNTER — Encounter (HOSPITAL_COMMUNITY): Payer: Self-pay | Admitting: *Deleted

## 2012-09-11 ENCOUNTER — Observation Stay (HOSPITAL_COMMUNITY)
Admission: EM | Admit: 2012-09-11 | Discharge: 2012-09-12 | Disposition: A | Discharge status: Home / Self Care | Payer: BC Managed Care – PPO | Attending: Internal Medicine | Admitting: Internal Medicine

## 2012-09-11 ENCOUNTER — Emergency Department (HOSPITAL_COMMUNITY): Payer: BC Managed Care – PPO

## 2012-09-11 DIAGNOSIS — R200 Anesthesia of skin: Secondary | ICD-10-CM

## 2012-09-11 DIAGNOSIS — R209 Unspecified disturbances of skin sensation: Secondary | ICD-10-CM | POA: Insufficient documentation

## 2012-09-11 DIAGNOSIS — F411 Generalized anxiety disorder: Secondary | ICD-10-CM | POA: Insufficient documentation

## 2012-09-11 DIAGNOSIS — F3289 Other specified depressive episodes: Secondary | ICD-10-CM | POA: Insufficient documentation

## 2012-09-11 DIAGNOSIS — K589 Irritable bowel syndrome without diarrhea: Secondary | ICD-10-CM | POA: Insufficient documentation

## 2012-09-11 DIAGNOSIS — R531 Weakness: Secondary | ICD-10-CM | POA: Diagnosis present

## 2012-09-11 DIAGNOSIS — R29810 Facial weakness: Secondary | ICD-10-CM | POA: Diagnosis present

## 2012-09-11 DIAGNOSIS — J45909 Unspecified asthma, uncomplicated: Secondary | ICD-10-CM | POA: Insufficient documentation

## 2012-09-11 DIAGNOSIS — F329 Major depressive disorder, single episode, unspecified: Secondary | ICD-10-CM | POA: Insufficient documentation

## 2012-09-11 DIAGNOSIS — M5090 Cervical disc disorder, unspecified, unspecified cervical region: Secondary | ICD-10-CM | POA: Insufficient documentation

## 2012-09-11 DIAGNOSIS — M358 Other specified systemic involvement of connective tissue: Secondary | ICD-10-CM | POA: Insufficient documentation

## 2012-09-11 DIAGNOSIS — R259 Unspecified abnormal involuntary movements: Secondary | ICD-10-CM | POA: Insufficient documentation

## 2012-09-11 DIAGNOSIS — G43909 Migraine, unspecified, not intractable, without status migrainosus: Secondary | ICD-10-CM | POA: Insufficient documentation

## 2012-09-11 DIAGNOSIS — R29898 Other symptoms and signs involving the musculoskeletal system: Principal | ICD-10-CM | POA: Insufficient documentation

## 2012-09-11 DIAGNOSIS — M3589 Other specified systemic involvement of connective tissue: Secondary | ICD-10-CM | POA: Insufficient documentation

## 2012-09-11 DIAGNOSIS — K219 Gastro-esophageal reflux disease without esophagitis: Secondary | ICD-10-CM | POA: Insufficient documentation

## 2012-09-11 DIAGNOSIS — R4789 Other speech disturbances: Secondary | ICD-10-CM | POA: Insufficient documentation

## 2012-09-11 LAB — URINALYSIS, ROUTINE W REFLEX MICROSCOPIC
Leukocytes, UA: NEGATIVE
Nitrite: NEGATIVE
Specific Gravity, Urine: 1.008 (ref 1.005–1.030)
pH: 6.5 (ref 5.0–8.0)

## 2012-09-11 LAB — CBC
HCT: 39.6 % (ref 36.0–46.0)
MCH: 27.5 pg (ref 26.0–34.0)
MCHC: 34.6 g/dL (ref 30.0–36.0)
MCV: 79.5 fL (ref 78.0–100.0)
Platelets: 206 10*3/uL (ref 150–400)
RBC: 4.98 MIL/uL (ref 3.87–5.11)
RDW: 13.3 % (ref 11.5–15.5)
WBC: 3.8 10*3/uL — ABNORMAL LOW (ref 4.0–10.5)

## 2012-09-11 LAB — COMPREHENSIVE METABOLIC PANEL
ALT: 21 U/L (ref 0–35)
Albumin: 3.9 g/dL (ref 3.5–5.2)
Alkaline Phosphatase: 66 U/L (ref 39–117)
Chloride: 102 mEq/L (ref 96–112)
Potassium: 3.6 mEq/L (ref 3.5–5.1)
Sodium: 137 mEq/L (ref 135–145)
Total Bilirubin: 0.5 mg/dL (ref 0.3–1.2)
Total Protein: 7.2 g/dL (ref 6.0–8.3)

## 2012-09-11 LAB — DIFFERENTIAL
Basophils Relative: 1 % (ref 0–1)
Eosinophils Absolute: 0.1 10*3/uL (ref 0.0–0.7)
Monocytes Relative: 9 % (ref 3–12)
Neutrophils Relative %: 58 % (ref 43–77)

## 2012-09-11 LAB — POCT I-STAT TROPONIN I

## 2012-09-11 LAB — APTT: aPTT: 30 seconds (ref 24–37)

## 2012-09-11 LAB — URINE MICROSCOPIC-ADD ON

## 2012-09-11 NOTE — Consult Note (Addendum)
NEURO HOSPITALIST CONSULT NOTE    Reason for Consult: left sided paresthesias and weakness, left hand tremors  HPI:                                                                                                                                          Lori Mercado is an 37 y.o. female with a past medical history significant or migraine, asthma, poorly defined connective tissue disorder, IBS, cervical disc disease, and recurrent episodes of transient sensory and motor symptoms affecting the left hemibody. She stated that she was doing well until yesterday when she developed gradual onset of numbness involving the left face-arm-leg, associated with weakness of the left arm and leg as well as droopy left face and off an on slurred speech. Stated that she has been drooping things from her left hand and started dragging the left leg today. No associated HA, vertigo, double vision, confusion, unsteadiness, falls, bladder or bowel impairment. Of importance, she said that this is the 3rd episode  of the above mentioned symptoms since July, but this time is lasting longer than usual. As a matter of fact, she was previously seen by neurology in this hospital and her syndrome was considered to be functional. Her symptoms prompted MRI brain without contrast that showed a solitary hyperintensity right frontal parietal white  matter unchanged from 2013.      Past Medical History  Diagnosis Date  . IBS (irritable bowel syndrome)   . Reflux   . Migraine   . Asthma   . 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 Cesarean section  2005    twins  . Endometrial ablation  06/2010    her option    Family History  Problem Relation Age of Onset  . Hypertension Father   . Arthritis Mother   . Multiple sclerosis Maternal Aunt   . Osteoarthritis Maternal  Grandmother   . Heart attack Maternal Grandfather   . Stroke Paternal Grandmother   . Stroke Paternal Grandfather   . Lupus Cousin     Family History: non contributory   Social History:  reports that she has never smoked. She does not have any smokeless tobacco history on file. She reports that  drinks alcohol. She reports that she does not use illicit drugs.  Allergies  Allergen Reactions  . Aspirin Other (See Comments)    "Asthma"  . Fruit & Vegetable Daily [Nutritional Supplements] Anaphylaxis  . Other Anaphylaxis    Tree nuts    MEDICATIONS:  I have reviewed the patient's current medications.   ROS:                                                                                                                                       History obtained from the patient and chart review.  General ROS: negative for - chills, fatigue, fever, night sweats, weight gain or weight loss Psychological ROS: negative for - behavioral disorder, hallucinations, memory difficulties, mood swings or suicidal ideation Ophthalmic ROS: negative for - blurry vision, double vision, eye pain or loss of vision ENT ROS: negative for - epistaxis, nasal discharge, oral lesions, sore throat, tinnitus or vertigo Allergy and Immunology ROS: negative for - hives or itchy/watery eyes Hematological and Lymphatic ROS: negative for - bleeding problems, bruising or swollen lymph nodes Endocrine ROS: negative for - galactorrhea, hair pattern changes, polydipsia/polyuria or temperature intolerance Respiratory ROS: negative for - cough, hemoptysis, shortness of breath or wheezing Cardiovascular ROS: negative for - chest pain, dyspnea on exertion, edema or irregular heartbeat Gastrointestinal ROS: negative for - abdominal pain, diarrhea, hematemesis, nausea/vomiting or stool incontinence Genito-Urinary  ROS: negative for - dysuria, hematuria, incontinence or urinary frequency/urgency Musculoskeletal ROS: negative for - joint swelling Neurological ROS: as noted in HPI Dermatological ROS: negative for rash and skin lesion changes     Physical exam: pleasant female in no apparent distress.Blood pressure 111/68, pulse 80, temperature 98.6 F (37 C), temperature source Oral, resp. rate 26, last menstrual period 09/07/2012, SpO2 100.00%.  Head: normocephalic. Neck: supple, no bruits, no JVD. Cardiac: no murmurs. Lungs: clear. Abdomen: soft, no tender, no mass. Extremities: no edema.  Neurologic Examination:                                                                                                      Mental Status: Alert, oriented, thought content appropriate.  Speech fluent without evidence of aphasia.  Able to follow 3 step commands without difficulty. Cranial Nerves: II: Discs flat bilaterally; Visual fields grossly normal, pupils equal, round, reactive to light and accommodation III,IV, VI: ptosis not present, extra-ocular motions intact bilaterally V: facial light touch sensation normal bilaterally VII: left lower face weakness??? VIII: hearing normal bilaterally IX,X: gag reflex present XI: bilateral shoulder shrug XII: midline tongue extension Motor: 5/5 right side. Mild weakness left arm-leg, but I believe she is not offering full effort. Tone and bulk:normal tone throughout; no atrophy noted Sensory: Pinprick and light touch intact throughout, bilaterally Deep Tendon Reflexes:  2  all over  Plantars: Right: downgoing   Left: downgoing Cerebellar: Postural tremor left hand,  normal heel-to-shin test Gait:  No frank ataxia CV: pulses palpable throughout    No results found for this basename: cbc, bmp, coags, chol, tri, ldl, hga1c    Results for orders placed during the hospital encounter of 09/11/12 (from the past 48 hour(s))  GLUCOSE, CAPILLARY     Status:  Abnormal   Collection Time    09/11/12  1:46 PM      Result Value Range   Glucose-Capillary 102 (*) 70 - 99 mg/dL  PROTIME-INR     Status: None   Collection Time    09/11/12  1:49 PM      Result Value Range   Prothrombin Time 12.9  11.6 - 15.2 seconds   INR 0.99  0.00 - 1.49  APTT     Status: None   Collection Time    09/11/12  1:49 PM      Result Value Range   aPTT 30  24 - 37 seconds  CBC     Status: Abnormal   Collection Time    09/11/12  1:49 PM      Result Value Range   WBC 3.8 (*) 4.0 - 10.5 K/uL   RBC 4.98  3.87 - 5.11 MIL/uL   Hemoglobin 13.7  12.0 - 15.0 g/dL   HCT 40.9  81.1 - 91.4 %   MCV 79.5  78.0 - 100.0 fL   MCH 27.5  26.0 - 34.0 pg   MCHC 34.6  30.0 - 36.0 g/dL   RDW 78.2  95.6 - 21.3 %   Platelets 206  150 - 400 K/uL  DIFFERENTIAL     Status: None   Collection Time    09/11/12  1:49 PM      Result Value Range   Neutrophils Relative % 58  43 - 77 %   Neutro Abs 2.2  1.7 - 7.7 K/uL   Lymphocytes Relative 29  12 - 46 %   Lymphs Abs 1.1  0.7 - 4.0 K/uL   Monocytes Relative 9  3 - 12 %   Monocytes Absolute 0.3  0.1 - 1.0 K/uL   Eosinophils Relative 4  0 - 5 %   Eosinophils Absolute 0.1  0.0 - 0.7 K/uL   Basophils Relative 1  0 - 1 %   Basophils Absolute 0.0  0.0 - 0.1 K/uL  COMPREHENSIVE METABOLIC PANEL     Status: None   Collection Time    09/11/12  1:49 PM      Result Value Range   Sodium 137  135 - 145 mEq/L   Potassium 3.6  3.5 - 5.1 mEq/L   Chloride 102  96 - 112 mEq/L   CO2 25  19 - 32 mEq/L   Glucose, Bld 91  70 - 99 mg/dL   BUN 11  6 - 23 mg/dL   Creatinine, Ser 0.86  0.50 - 1.10 mg/dL   Calcium 9.3  8.4 - 57.8 mg/dL   Total Protein 7.2  6.0 - 8.3 g/dL   Albumin 3.9  3.5 - 5.2 g/dL   AST 19  0 - 37 U/L   ALT 21  0 - 35 U/L   Alkaline Phosphatase 66  39 - 117 U/L   Total Bilirubin 0.5  0.3 - 1.2 mg/dL   GFR calc non Af Amer >90  >90 mL/min   GFR calc Af Amer >90  >90 mL/min  Comment: (NOTE)     The eGFR has been calculated using the  CKD EPI equation.     This calculation has not been validated in all clinical situations.     eGFR's persistently <90 mL/min signify possible Chronic Kidney     Disease.  TROPONIN I     Status: None   Collection Time    09/11/12  1:49 PM      Result Value Range   Troponin I <0.30  <0.30 ng/mL   Comment:            Due to the release kinetics of cTnI,     a negative result within the first hours     of the onset of symptoms does not rule out     myocardial infarction with certainty.     If myocardial infarction is still suspected,     repeat the test at appropriate intervals.  POCT I-STAT TROPONIN I     Status: None   Collection Time    09/11/12  2:12 PM      Result Value Range   Troponin i, poc 0.00  0.00 - 0.08 ng/mL   Comment 3            Comment: Due to the release kinetics of cTnI,     a negative result within the first hours     of the onset of symptoms does not rule out     myocardial infarction with certainty.     If myocardial infarction is still suspected,     repeat the test at appropriate intervals.  URINALYSIS, ROUTINE W REFLEX MICROSCOPIC     Status: Abnormal   Collection Time    09/11/12  4:36 PM      Result Value Range   Color, Urine YELLOW  YELLOW   APPearance CLEAR  CLEAR   Specific Gravity, Urine 1.008  1.005 - 1.030   pH 6.5  5.0 - 8.0   Glucose, UA NEGATIVE  NEGATIVE mg/dL   Hgb urine dipstick TRACE (*) NEGATIVE   Bilirubin Urine NEGATIVE  NEGATIVE   Ketones, ur NEGATIVE  NEGATIVE mg/dL   Protein, ur NEGATIVE  NEGATIVE mg/dL   Urobilinogen, UA 0.2  0.0 - 1.0 mg/dL   Nitrite NEGATIVE  NEGATIVE   Leukocytes, UA NEGATIVE  NEGATIVE  URINE MICROSCOPIC-ADD ON     Status: None   Collection Time    09/11/12  4:36 PM      Result Value Range   Squamous Epithelial / LPF RARE  RARE   WBC, UA 0-2  <3 WBC/hpf   RBC / HPF 0-2  <3 RBC/hpf   Bacteria, UA RARE  RARE  GLUCOSE, CAPILLARY     Status: None   Collection Time    09/11/12  5:45 PM      Result Value  Range   Glucose-Capillary 90  70 - 99 mg/dL    Mr Brain Wo Contrast  09/11/2012   *RADIOLOGY REPORT*  Clinical Data: Left-sided weakness  MRI HEAD WITHOUT CONTRAST  Technique:  Multiplanar, multiecho pulse sequences of the brain and surrounding structures were obtained according to standard protocol without intravenous contrast.  Comparison: CT 07/04/2012.  MRI 12/13/2011  Findings: Solitary 4 mm hyperintensity right frontal parietal white matter unchanged from 2013.  Remainder of  the white matter is normal.  Basal ganglia,  brainstem are normal.  Negative for acute infarct.  Negative for hemorrhage or mass lesion.  Ventricle size is normal.  No midline shift.  Paranasal sinuses are clear.  IMPRESSION: No acute abnormality and no change from 12/13/2011.  Solitary hyperintensity in the right frontal parietal white matter.  This may be due to chronic microvascular ischemia, migraine headache, and less likely demyelinating disease.   Original Report Authenticated By: Janeece Riggers, M.D.     Assessment/Plan: 37 y/o with recurrent episodes of transient left sided paresthesias/weakness including left lower face, slurred speech, and tremors left hand. Neuro-exam with ? Left face and left arm weakness. Postural tremor left hand. MR-DWI brain without contrast that showed a solitary hyperintensity right frontal parietal white matter unchanged from 2013 which I believe is no specific and unrelated to patient clinical presentation. She said that it is hard for her to walk. I suggested admission to the hospital. MRI cervico-thoracic spine searching for any lesions that could explain her symptoms, but the yield will be low as she has unilateral episodic symptoms. My index of suspicion for a demyelinating disorder is extremely low at this moment and thus will not pursue spinal tap for now.   Wyatt Portela, D [facility] [phone]  09/11/2012, 10:19 PM

## 2012-09-11 NOTE — ED Notes (Signed)
Neurologist at bedside. 

## 2012-09-11 NOTE — ED Notes (Signed)
Checked patient blood sugar it was 90 notified RN Chris of blood sugar

## 2012-09-11 NOTE — ED Notes (Signed)
Pt has had 3 episodes of left sided weakness that resolved.  Yesterday started having left sided facial droop, left arm weakness, tremors, left eye dry, history of migraines.  Symptoms started at 1400 yesterday.

## 2012-09-11 NOTE — ED Notes (Signed)
Pt transported to MRI 

## 2012-09-11 NOTE — ED Provider Notes (Signed)
CSN: [id number]     Arrival date & time 09/11/12  1341 History   First MD Initiated Contact with Patient 09/11/12 1526     Chief Complaint  Patient presents with  . Stroke Symptoms   (Consider location/radiation/quality/duration/timing/severity/associated sxs/prior Treatment) HPI Patient states this is the third episode that she has had numbness in her left face and left extremities. She relates first time was in July and it lasted about 6 hours. She states it also happened about 3 weeks ago again in a lasted about 6 hours. She states yesterday about 2 PM she started having some pain in her joints and she felt very tired but she needed to lay down and noted she had numbness in left side of her face and her left arm is numb. Her husband also reports since yesterday she has a shuffling gait he has to help her ambulate. She states however this time the numbness and weakness has persisted since yesterday. She states she is normally left-handed. She states she is not on any type of hormones. She states she was seen in the ED when she had the episode in July and was actually seen by a neurologist. She states she was told her CT scan was normal. Pt states she has connective tissue disease and possibly lupus or RA.  She reports she was recently started this past week, 4 days ago on Celexa and Xanax prn.  She reports history of strokes however that it was in her paternal grandparents.   PCP Dr Nehemiah Settle Rheumatologist Dr Lendon Colonel  Past Medical History  Diagnosis Date  . IBS (irritable bowel syndrome)   . Reflux   . Migraine   . Asthma   . 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 Cesarean section  2005    twins  . Endometrial ablation  06/2010    her option   Family History  Problem Relation Age of Onset  . Hypertension Father   . Arthritis Mother   . Multiple sclerosis  Maternal Aunt   . Osteoarthritis Maternal Grandmother   . Heart attack Maternal Grandfather   . Stroke Paternal Grandmother   . Stroke Paternal Grandfather   . Lupus Cousin    History  Substance Use Topics  . Smoking status: Never Smoker   . Smokeless tobacco: Not on file  . Alcohol Use: Yes     Comment: Drinks wine on occasion  lives at home Lives with spouse Teaches special education   OB History   Grav Para Term Preterm Abortions TAB SAB Ect Mult Living                 Review of Systems  All other systems reviewed and are negative.    Allergies  Aspirin; Fruit & vegetable daily; and Other  Home Medications   Current Outpatient Rx  Name  Route  Sig  Dispense  Refill  . citalopram (CELEXA) 10 MG tablet   Oral   Take 10 mg by mouth daily.         . hydroxychloroquine (PLAQUENIL) 200 MG tablet   Oral   Take 200 mg by mouth daily.          Celexa Xanax   BP 124/67  Pulse 77  Temp(Src) 98.3 F (36.8 C) (Oral)  Resp 19  SpO2 100%  LMP 09/07/2012  Vital  signs normal    Physical Exam  Nursing note and vitals reviewed. Constitutional: She is oriented to person, place, and time. She appears well-developed and well-nourished.  Non-toxic appearance. She does not appear ill. No distress.  HENT:  Head: Normocephalic and atraumatic.  Right Ear: External ear normal.  Left Ear: External ear normal.  Nose: Nose normal. No mucosal edema or rhinorrhea.  Mouth/Throat: Oropharynx is clear and moist and mucous membranes are normal. No dental abscesses or edematous.  Eyes: Conjunctivae and EOM are normal. Pupils are equal, round, and reactive to light.  Neck: Normal range of motion and full passive range of motion without pain. Neck supple.  Cardiovascular: Normal rate, regular rhythm and normal heart sounds.  Exam reveals no gallop and no friction rub.   No murmur heard. Pulmonary/Chest: Effort normal and breath sounds normal. No respiratory distress. She has no  wheezes. She has no rhonchi. She has no rales. She exhibits no tenderness and no crepitus.  Abdominal: Soft. Normal appearance and bowel sounds are normal. She exhibits no distension. There is no tenderness. There is no rebound and no guarding.  Musculoskeletal: Normal range of motion. She exhibits no edema and no tenderness.  Moves all extremities well.   Neurological: She is alert and oriented to person, place, and time. She has normal strength. No cranial nerve deficit.  Pt has left facial droop, eyebrows are non-involved bilaterally,  Has some tremor of her LUE, has some wavering with FTN with LUE, not RUE, has unsteadiness doing shin slide with LLE but not the RLE.  Has decreased sensation of her left face and LUE.   Skin: Skin is warm, dry and intact. No rash noted. No erythema. No pallor.  Psychiatric: She has a normal mood and affect. Her speech is normal and behavior is normal. Her mood appears not anxious.    ED Course  Procedures (including critical care time  Review of old visit shows she was seen on 6/24 and code stroke was called. Her left facial weakness was felt to be functional by the neurologist and she had a normal CT head. Neurology recommended medication for anxiety and/or psychiatric consult if symptoms persisted.   21:09 Dr Leroy Kennedy, neurology will see patient.  Pt given results of her MR and that she was going to see a neurologist tonight. Pt was eating and her face looked symmetrical. However after we started talking she had her lips pursed to the left.   Dr Leroy Kennedy has seen patient, he recommends admission over night to get MR of her spine. He does not recommend steroids at this time for possible connective tissue disorder.   23:09 Dr Julian Reil, will look at patient.  Labs Review  Results for orders placed during the hospital encounter of 09/11/12  PROTIME-INR      Result Value Range   Prothrombin Time 12.9  11.6 - 15.2 seconds   INR 0.99  0.00 - 1.49  APTT       Result Value Range   aPTT 30  24 - 37 seconds  CBC      Result Value Range   WBC 3.8 (*) 4.0 - 10.5 K/uL   RBC 4.98  3.87 - 5.11 MIL/uL   Hemoglobin 13.7  12.0 - 15.0 g/dL   HCT 11.9  14.7 - 82.9 %   MCV 79.5  78.0 - 100.0 fL   MCH 27.5  26.0 - 34.0 pg   MCHC 34.6  30.0 - 36.0 g/dL   RDW 13.3  11.5 - 15.5 %   Platelets 206  150 - 400 K/uL  DIFFERENTIAL      Result Value Range   Neutrophils Relative % 58  43 - 77 %   Neutro Abs 2.2  1.7 - 7.7 K/uL   Lymphocytes Relative 29  12 - 46 %   Lymphs Abs 1.1  0.7 - 4.0 K/uL   Monocytes Relative 9  3 - 12 %   Monocytes Absolute 0.3  0.1 - 1.0 K/uL   Eosinophils Relative 4  0 - 5 %   Eosinophils Absolute 0.1  0.0 - 0.7 K/uL   Basophils Relative 1  0 - 1 %   Basophils Absolute 0.0  0.0 - 0.1 K/uL  COMPREHENSIVE METABOLIC PANEL      Result Value Range   Sodium 137  135 - 145 mEq/L   Potassium 3.6  3.5 - 5.1 mEq/L   Chloride 102  96 - 112 mEq/L   CO2 25  19 - 32 mEq/L   Glucose, Bld 91  70 - 99 mg/dL   BUN 11  6 - 23 mg/dL   Creatinine, Ser 1.61  0.50 - 1.10 mg/dL   Calcium 9.3  8.4 - 09.6 mg/dL   Total Protein 7.2  6.0 - 8.3 g/dL   Albumin 3.9  3.5 - 5.2 g/dL   AST 19  0 - 37 U/L   ALT 21  0 - 35 U/L   Alkaline Phosphatase 66  39 - 117 U/L   Total Bilirubin 0.5  0.3 - 1.2 mg/dL   GFR calc non Af Amer >90  >90 mL/min   GFR calc Af Amer >90  >90 mL/min  TROPONIN I      Result Value Range   Troponin I <0.30  <0.30 ng/mL  GLUCOSE, CAPILLARY      Result Value Range   Glucose-Capillary 102 (*) 70 - 99 mg/dL  URINALYSIS, ROUTINE W REFLEX MICROSCOPIC      Result Value Range   Color, Urine YELLOW  YELLOW   APPearance CLEAR  CLEAR   Specific Gravity, Urine 1.008  1.005 - 1.030   pH 6.5  5.0 - 8.0   Glucose, UA NEGATIVE  NEGATIVE mg/dL   Hgb urine dipstick TRACE (*) NEGATIVE   Bilirubin Urine NEGATIVE  NEGATIVE   Ketones, ur NEGATIVE  NEGATIVE mg/dL   Protein, ur NEGATIVE  NEGATIVE mg/dL   Urobilinogen, UA 0.2  0.0 - 1.0 mg/dL    Nitrite NEGATIVE  NEGATIVE   Leukocytes, UA NEGATIVE  NEGATIVE  URINE MICROSCOPIC-ADD ON      Result Value Range   Squamous Epithelial / LPF RARE  RARE   WBC, UA 0-2  <3 WBC/hpf   RBC / HPF 0-2  <3 RBC/hpf   Bacteria, UA RARE  RARE  GLUCOSE, CAPILLARY      Result Value Range   Glucose-Capillary 90  70 - 99 mg/dL  POCT I-STAT TROPONIN I      Result Value Range   Troponin i, poc 0.00  0.00 - 0.08 ng/mL   Comment 3            Laboratory interpretation all normal except borderline total WBC without neutropenia    Imaging Review Mr Brain Wo Contrast  09/11/2012   *RADIOLOGY REPORT*  Clinical Data: Left-sided weakness  MRI HEAD WITHOUT CONTRAST  Technique:  Multiplanar, multiecho pulse sequences of the brain and surrounding structures were obtained according to standard protocol without intravenous contrast.  Comparison: CT 07/04/2012.  MRI 12/13/2011  Findings: Solitary 4 mm hyperintensity right frontal parietal white matter unchanged from 2013.  Remainder of  the white matter is normal.  Basal ganglia,  brainstem are normal.  Negative for acute infarct.  Negative for hemorrhage or mass lesion.  Ventricle size is normal.  No midline shift.  Paranasal sinuses are clear.  IMPRESSION: No acute abnormality and no change from 12/13/2011.  Solitary hyperintensity in the right frontal parietal white matter.  This may be due to chronic microvascular ischemia, migraine headache, and less likely demyelinating disease.   Original Report Authenticated By: Janeece Riggers, M.D.     Date: 09/11/2012  Rate: 71  Rhythm: normal sinus rhythm  QRS Axis: right  Intervals: normal  ST/T Wave abnormalities: normal  Conduction Disutrbances:none  Narrative Interpretation:   Old EKG Reviewed: none available    MDM   1. Left facial numbness   2. Numbness and tingling of left arm and leg   3. Weakness of left side of body     Plan per Dr Jonell Cluck, MD, Franz Dell, MD 09/11/12  [id number]

## 2012-09-12 ENCOUNTER — Observation Stay (HOSPITAL_COMMUNITY): Payer: BC Managed Care – PPO

## 2012-09-12 DIAGNOSIS — M6281 Muscle weakness (generalized): Secondary | ICD-10-CM

## 2012-09-12 DIAGNOSIS — R29810 Facial weakness: Secondary | ICD-10-CM

## 2012-09-12 DIAGNOSIS — R209 Unspecified disturbances of skin sensation: Secondary | ICD-10-CM

## 2012-09-12 DIAGNOSIS — R531 Weakness: Secondary | ICD-10-CM | POA: Diagnosis present

## 2012-09-12 MED ORDER — HEPARIN SODIUM (PORCINE) 5000 UNIT/ML IJ SOLN
5000.0000 [IU] | Freq: Three times a day (TID) | INTRAMUSCULAR | Status: DC
  Administered 2012-09-12 (×2): 5000 [IU] via SUBCUTANEOUS
  Filled 2012-09-12 (×4): qty 1

## 2012-09-12 MED ORDER — ACETAMINOPHEN 325 MG PO TABS
650.0000 mg | ORAL_TABLET | Freq: Once | ORAL | Status: AC
  Administered 2012-09-12: 650 mg via ORAL
  Filled 2012-09-12: qty 2

## 2012-09-12 MED ORDER — ACETAMINOPHEN 325 MG PO TABS
650.0000 mg | ORAL_TABLET | Freq: Four times a day (QID) | ORAL | Status: DC | PRN
  Administered 2012-09-12: 650 mg via ORAL
  Filled 2012-09-12: qty 2

## 2012-09-12 MED ORDER — HYDROXYCHLOROQUINE SULFATE 200 MG PO TABS
200.0000 mg | ORAL_TABLET | Freq: Every day | ORAL | Status: DC
Start: 2012-09-12 — End: 2012-09-12
  Administered 2012-09-12: 200 mg via ORAL
  Filled 2012-09-12: qty 1

## 2012-09-12 MED ORDER — CITALOPRAM HYDROBROMIDE 10 MG PO TABS
10.0000 mg | ORAL_TABLET | Freq: Every day | ORAL | Status: DC
Start: 2012-09-12 — End: 2012-09-12
  Filled 2012-09-12 (×2): qty 1

## 2012-09-12 MED ORDER — GADOBENATE DIMEGLUMINE 529 MG/ML IV SOLN
15.0000 mL | Freq: Once | INTRAVENOUS | Status: AC
  Administered 2012-09-12: 12 mL via INTRAVENOUS

## 2012-09-12 MED ORDER — PANTOPRAZOLE SODIUM 40 MG PO TBEC
40.0000 mg | DELAYED_RELEASE_TABLET | Freq: Every day | ORAL | Status: DC
  Administered 2012-09-12: 40 mg via ORAL
  Filled 2012-09-12: qty 1

## 2012-09-12 NOTE — Progress Notes (Addendum)
Subjective: Patient pleasant, no apparent distress. Admission H&P reviewed patient felt to have functional calls for her left-sided symptoms. Of note she's had outpatient MRI of brain and cervical spine in the past. She also has been seen by neurology in the emergency room in the past as well as in the office. She has seen neurosurgery in past for the cervical disease, surgery was recommended, but she refused.  Patient is able to ambulate without any difficulty there are no signs of left side weakness. She does have her face somewhat contracted on the left corner.  Objective: Vital signs in last 24 hours: Temp:  [98.1 F (36.7 C)-98.8 F (37.1 C)] 98.8 F (37.1 C) (09/02 0945) Pulse Rate:  [61-83] 67 (09/02 0945) Resp:  [11-29] 16 (09/02 0945) BP: (99-132)/(54-84) 110/75 mmHg (09/02 0945) SpO2:  [96 %-100 %] 96 % (09/02 0945) Weight:  [63.5 kg (139 lb 15.9 oz)] 63.5 kg (139 lb 15.9 oz) (09/02 0202) Weight change:  Last BM Date: 09/11/12  Intake/Output from previous day:   Intake/Output this shift:    General appearance: alert and cooperative Resp: clear to auscultation bilaterally Cardio: regular rate and rhythm, S1, S2 normal, no murmur, click, rub or gallop Neurologic: Alert and oriented X 3, normal strength and tone. Normal symmetric reflexes. Normal coordination and gait Cranial nerves: normal, except somewhat spasm-like contracted area of lower lip Motor: grossly normal  Lab Results:  Results for orders placed during the hospital encounter of 09/11/12 (from the past 24 hour(s))  GLUCOSE, CAPILLARY     Status: Abnormal   Collection Time    09/11/12  1:46 PM      Result Value Range   Glucose-Capillary 102 (*) 70 - 99 mg/dL  PROTIME-INR     Status: None   Collection Time    09/11/12  1:49 PM      Result Value Range   Prothrombin Time 12.9  11.6 - 15.2 seconds   INR 0.99  0.00 - 1.49  APTT     Status: None   Collection Time    09/11/12  1:49 PM      Result Value Range    aPTT 30  24 - 37 seconds  CBC     Status: Abnormal   Collection Time    09/11/12  1:49 PM      Result Value Range   WBC 3.8 (*) 4.0 - 10.5 K/uL   RBC 4.98  3.87 - 5.11 MIL/uL   Hemoglobin 13.7  12.0 - 15.0 g/dL   HCT 16.1  09.6 - 04.5 %   MCV 79.5  78.0 - 100.0 fL   MCH 27.5  26.0 - 34.0 pg   MCHC 34.6  30.0 - 36.0 g/dL   RDW 40.9  81.1 - 91.4 %   Platelets 206  150 - 400 K/uL  DIFFERENTIAL     Status: None   Collection Time    09/11/12  1:49 PM      Result Value Range   Neutrophils Relative % 58  43 - 77 %   Neutro Abs 2.2  1.7 - 7.7 K/uL   Lymphocytes Relative 29  12 - 46 %   Lymphs Abs 1.1  0.7 - 4.0 K/uL   Monocytes Relative 9  3 - 12 %   Monocytes Absolute 0.3  0.1 - 1.0 K/uL   Eosinophils Relative 4  0 - 5 %   Eosinophils Absolute 0.1  0.0 - 0.7 K/uL   Basophils Relative 1  0 -  1 %   Basophils Absolute 0.0  0.0 - 0.1 K/uL  COMPREHENSIVE METABOLIC PANEL     Status: None   Collection Time    09/11/12  1:49 PM      Result Value Range   Sodium 137  135 - 145 mEq/L   Potassium 3.6  3.5 - 5.1 mEq/L   Chloride 102  96 - 112 mEq/L   CO2 25  19 - 32 mEq/L   Glucose, Bld 91  70 - 99 mg/dL   BUN 11  6 - 23 mg/dL   Creatinine, Ser 0.45  0.50 - 1.10 mg/dL   Calcium 9.3  8.4 - 40.9 mg/dL   Total Protein 7.2  6.0 - 8.3 g/dL   Albumin 3.9  3.5 - 5.2 g/dL   AST 19  0 - 37 U/L   ALT 21  0 - 35 U/L   Alkaline Phosphatase 66  39 - 117 U/L   Total Bilirubin 0.5  0.3 - 1.2 mg/dL   GFR calc non Af Amer >90  >90 mL/min   GFR calc Af Amer >90  >90 mL/min  TROPONIN I     Status: None   Collection Time    09/11/12  1:49 PM      Result Value Range   Troponin I <0.30  <0.30 ng/mL  POCT I-STAT TROPONIN I     Status: None   Collection Time    09/11/12  2:12 PM      Result Value Range   Troponin i, poc 0.00  0.00 - 0.08 ng/mL   Comment 3           URINALYSIS, ROUTINE W REFLEX MICROSCOPIC     Status: Abnormal   Collection Time    09/11/12  4:36 PM      Result Value Range    Color, Urine YELLOW  YELLOW   APPearance CLEAR  CLEAR   Specific Gravity, Urine 1.008  1.005 - 1.030   pH 6.5  5.0 - 8.0   Glucose, UA NEGATIVE  NEGATIVE mg/dL   Hgb urine dipstick TRACE (*) NEGATIVE   Bilirubin Urine NEGATIVE  NEGATIVE   Ketones, ur NEGATIVE  NEGATIVE mg/dL   Protein, ur NEGATIVE  NEGATIVE mg/dL   Urobilinogen, UA 0.2  0.0 - 1.0 mg/dL   Nitrite NEGATIVE  NEGATIVE   Leukocytes, UA NEGATIVE  NEGATIVE  URINE MICROSCOPIC-ADD ON     Status: None   Collection Time    09/11/12  4:36 PM      Result Value Range   Squamous Epithelial / LPF RARE  RARE   WBC, UA 0-2  <3 WBC/hpf   RBC / HPF 0-2  <3 RBC/hpf   Bacteria, UA RARE  RARE  GLUCOSE, CAPILLARY     Status: None   Collection Time    09/11/12  5:45 PM      Result Value Range   Glucose-Capillary 90  70 - 99 mg/dL      Studies/Results: Mr Brain Wo Contrast  09/11/2012   *RADIOLOGY REPORT*  Clinical Data: Left-sided weakness  MRI HEAD WITHOUT CONTRAST  Technique:  Multiplanar, multiecho pulse sequences of the brain and surrounding structures were obtained according to standard protocol without intravenous contrast.  Comparison: CT 07/04/2012.  MRI 12/13/2011  Findings: Solitary 4 mm hyperintensity right frontal parietal white matter unchanged from 2013.  Remainder of  the white matter is normal.  Basal ganglia,  brainstem are normal.  Negative for acute infarct.  Negative for  hemorrhage or mass lesion.  Ventricle size is normal.  No midline shift.  Paranasal sinuses are clear.  IMPRESSION: No acute abnormality and no change from 12/13/2011.  Solitary hyperintensity in the right frontal parietal white matter.  This may be due to chronic microvascular ischemia, migraine headache, and less likely demyelinating disease.   Original Report Authenticated By: Janeece Riggers, M.D.   Mr Cervical Spine W Wo Contrast  09/12/2012   *RADIOLOGY REPORT*  Clinical Data:  Recurrent left-sided weakness and left and tremor. Left facial droop and  slurred speech.  The patient has had additional similar episodes recently.  MRI CERVICAL AND THORACIC SPINE WITHOUT AND WITH CONTRAST  Technique:  Multiplanar and multiecho pulse sequences of the cervical spine, to include the craniocervical junction and cervicothoracic junction, and the thoracic spine, were obtained without and with intravenous contrast.  Contrast: 12mL MULTIHANCE GADOBENATE DIMEGLUMINE 529 MG/ML IV SOLN  Comparison:  MRI brain 09/11/2012.  MRI cervical spine 02/26/2011.  MRI CERVICAL SPINE  Findings:  Normal signal is present in the cervical upper thoracic spinal cord to the lowest imaged level, at T2-3.  Marrow signal and vertebral body heights are normal.  Slight retrolisthesis at C5-6 is stable.  There is some straightening of the normal cervical lordosis.  Flow is present in the vertebral arteries.  The visualized soft tissues are unremarkable.  C2-3:  Negative.  C3-4:  A shallow central disc protrusion is present without significant stenosis.  C4-5:  A shallow left paracentral protrusion is new.  This could contact ventral surface of the cord without significant cord distortion or abnormal signal.  C5-6:  Mild broad-based disc osteophyte complex is asymmetric to the left.  Moderate left and mild right foraminal narrowing has progressed.  There is no significant distortion or abnormal signal in the cord.  C6-7:  Negative.  C7-T1:  Negative.  The postcontrast images demonstrate no pathologic enhancement.  IMPRESSION:  1.  New shallow left paracentral protrusion at C4-5 with potential contact of the cord but no abnormal cord signal. 2.  Progressive leftward disc osteophyte complex at C5-6 with moderate left mild right foraminal stenosis.  MRI THORACIC SPINE  Findings: Normal signal is present throughout the thoracic spinal cord.  Conus medullaris terminates at L1.  Marrow signal, vertebral body heights, alignment are normal.  No significant disc herniations or focal stenosis is present.  The  foramina are present bilaterally.  The postcontrast images demonstrate no areas of pathologic enhancement.  Limited imaging of the abdomen is unremarkable.  IMPRESSION: Negative MRI of the thoracic spine.   Original Report Authenticated By: Marin Roberts, M.D.   Mr Thoracic Spine W Wo Contrast  09/12/2012   *RADIOLOGY REPORT*  Clinical Data:  Recurrent left-sided weakness and left and tremor. Left facial droop and slurred speech.  The patient has had additional similar episodes recently.  MRI CERVICAL AND THORACIC SPINE WITHOUT AND WITH CONTRAST  Technique:  Multiplanar and multiecho pulse sequences of the cervical spine, to include the craniocervical junction and cervicothoracic junction, and the thoracic spine, were obtained without and with intravenous contrast.  Contrast: 12mL MULTIHANCE GADOBENATE DIMEGLUMINE 529 MG/ML IV SOLN  Comparison:  MRI brain 09/11/2012.  MRI cervical spine 02/26/2011.  MRI CERVICAL SPINE  Findings:  Normal signal is present in the cervical upper thoracic spinal cord to the lowest imaged level, at T2-3.  Marrow signal and vertebral body heights are normal.  Slight retrolisthesis at C5-6 is stable.  There is some straightening of the normal cervical lordosis.  Flow is present in the vertebral arteries.  The visualized soft tissues are unremarkable.  C2-3:  Negative.  C3-4:  A shallow central disc protrusion is present without significant stenosis.  C4-5:  A shallow left paracentral protrusion is new.  This could contact ventral surface of the cord without significant cord distortion or abnormal signal.  C5-6:  Mild broad-based disc osteophyte complex is asymmetric to the left.  Moderate left and mild right foraminal narrowing has progressed.  There is no significant distortion or abnormal signal in the cord.  C6-7:  Negative.  C7-T1:  Negative.  The postcontrast images demonstrate no pathologic enhancement.  IMPRESSION:  1.  New shallow left paracentral protrusion at C4-5 with  potential contact of the cord but no abnormal cord signal. 2.  Progressive leftward disc osteophyte complex at C5-6 with moderate left mild right foraminal stenosis.  MRI THORACIC SPINE  Findings: Normal signal is present throughout the thoracic spinal cord.  Conus medullaris terminates at L1.  Marrow signal, vertebral body heights, alignment are normal.  No significant disc herniations or focal stenosis is present.  The foramina are present bilaterally.  The postcontrast images demonstrate no areas of pathologic enhancement.  Limited imaging of the abdomen is unremarkable.  IMPRESSION: Negative MRI of the thoracic spine.   Original Report Authenticated By: Marin Roberts, M.D.    Medications:  Prior to Admission:  Prescriptions prior to admission  Medication Sig Dispense Refill  . citalopram (CELEXA) 10 MG tablet Take 10 mg by mouth daily.      . hydroxychloroquine (PLAQUENIL) 200 MG tablet Take 200 mg by mouth daily.        Scheduled: . citalopram  10 mg Oral Daily  . heparin  5,000 Units Subcutaneous Q8H  . hydroxychloroquine  200 mg Oral Daily  . pantoprazole  40 mg Oral Daily   Continuous:   Assessment/Plan: Pleasant 37 year old female presented to the hospital with left-sided symptoms. MRI of the brain is negative for CVA. Patient has known cervical disc disease which may have progressed. MRI of her thoracic spine was negative. Patient's motor exam this morning was fairly unremarkable. This morning she states she was ready to go home. I will review results with her, her husband and neurology and then we'll make a final disposition at that time.  Mixed  connective tissue disease  Anxiousness/depression continue antidepressant   LOS: 1 day   Kamran Coker D 09/12/2012, 1:08 PM

## 2012-09-12 NOTE — ED Notes (Signed)
Pt c/o headache, Dr. Julian Reil aware.

## 2012-09-12 NOTE — Discharge Summary (Signed)
Physician Discharge Summary  Patient ID: Lori Mercado MRN: [id number] DOB/AGE: 1975/04/14 37 y.o.  Admit date: 09/11/2012 Discharge date: 09/12/2012  Admission Diagnoses:  Discharge Diagnoses:  Principal Problem:   Left-sided weakness Active Problems:   Facial weakness   Discharged Condition: stable  Hospital Course:  Patient presented to the hospital with left-sided symptoms of facial droop, numbness weakness. Patient was seen previously for similar symptoms, she had MRI of her brain without acute pathology. Her symptoms were felt to be functional however due to the recurrent nature of his symptoms admission was deemed necessary for further evaluation and treatment. Please see dictated H&P for further details. Past medical history medications social history past surgical history allergies family history per admission H&P. Hospital course patient was admitted to a medical floor bed. She was seen in consultation by neurology. Her symptoms again were felt to be functional and that further imaging would be low yield. She had a MRI of her brain negative for stroke, MRI of cervical spine which did show some known disc disease which may have progressed and also a normal MRI of her thoracic spine. Neurology has recommended a outpatient EMG. At this time patient is felt to be medically stable for discharge.  Consults:  neurology  Significant Diagnostic Studies:Mr Brain Wo Contrast  09/11/2012   *RADIOLOGY REPORT*  Clinical Data: Left-sided weakness  MRI HEAD WITHOUT CONTRAST  Technique:  Multiplanar, multiecho pulse sequences of the brain and surrounding structures were obtained according to standard protocol without intravenous contrast.  Comparison: CT 07/04/2012.  MRI 12/13/2011  Findings: Solitary 4 mm hyperintensity right frontal parietal white matter unchanged from 2013.  Remainder of  the white matter is normal.  Basal ganglia,  brainstem are normal.  Negative for acute infarct.  Negative for  hemorrhage or mass lesion.  Ventricle size is normal.  No midline shift.  Paranasal sinuses are clear.  IMPRESSION: No acute abnormality and no change from 12/13/2011.  Solitary hyperintensity in the right frontal parietal white matter.  This may be due to chronic microvascular ischemia, migraine headache, and less likely demyelinating disease.   Original Report Authenticated By: Janeece Riggers, M.D.   Mr Cervical Spine W Wo Contrast  09/12/2012   *RADIOLOGY REPORT*  Clinical Data:  Recurrent left-sided weakness and left and tremor. Left facial droop and slurred speech.  The patient has had additional similar episodes recently.  MRI CERVICAL AND THORACIC SPINE WITHOUT AND WITH CONTRAST  Technique:  Multiplanar and multiecho pulse sequences of the cervical spine, to include the craniocervical junction and cervicothoracic junction, and the thoracic spine, were obtained without and with intravenous contrast.  Contrast: 12mL MULTIHANCE GADOBENATE DIMEGLUMINE 529 MG/ML IV SOLN  Comparison:  MRI brain 09/11/2012.  MRI cervical spine 02/26/2011.  MRI CERVICAL SPINE  Findings:  Normal signal is present in the cervical upper thoracic spinal cord to the lowest imaged level, at T2-3.  Marrow signal and vertebral body heights are normal.  Slight retrolisthesis at C5-6 is stable.  There is some straightening of the normal cervical lordosis.  Flow is present in the vertebral arteries.  The visualized soft tissues are unremarkable.  C2-3:  Negative.  C3-4:  A shallow central disc protrusion is present without significant stenosis.  C4-5:  A shallow left paracentral protrusion is new.  This could contact ventral surface of the cord without significant cord distortion or abnormal signal.  C5-6:  Mild broad-based disc osteophyte complex is asymmetric to the left.  Moderate left and mild right foraminal  narrowing has progressed.  There is no significant distortion or abnormal signal in the cord.  C6-7:  Negative.  C7-T1:  Negative.   The postcontrast images demonstrate no pathologic enhancement.  IMPRESSION:  1.  New shallow left paracentral protrusion at C4-5 with potential contact of the cord but no abnormal cord signal. 2.  Progressive leftward disc osteophyte complex at C5-6 with moderate left mild right foraminal stenosis.  MRI THORACIC SPINE  Findings: Normal signal is present throughout the thoracic spinal cord.  Conus medullaris terminates at L1.  Marrow signal, vertebral body heights, alignment are normal.  No significant disc herniations or focal stenosis is present.  The foramina are present bilaterally.  The postcontrast images demonstrate no areas of pathologic enhancement.  Limited imaging of the abdomen is unremarkable.  IMPRESSION: Negative MRI of the thoracic spine.   Original Report Authenticated By: Marin Roberts, M.D.   Mr Thoracic Spine W Wo Contrast  09/12/2012   *RADIOLOGY REPORT*  Clinical Data:  Recurrent left-sided weakness and left and tremor. Left facial droop and slurred speech.  The patient has had additional similar episodes recently.  MRI CERVICAL AND THORACIC SPINE WITHOUT AND WITH CONTRAST  Technique:  Multiplanar and multiecho pulse sequences of the cervical spine, to include the craniocervical junction and cervicothoracic junction, and the thoracic spine, were obtained without and with intravenous contrast.  Contrast: 12mL MULTIHANCE GADOBENATE DIMEGLUMINE 529 MG/ML IV SOLN  Comparison:  MRI brain 09/11/2012.  MRI cervical spine 02/26/2011.  MRI CERVICAL SPINE  Findings:  Normal signal is present in the cervical upper thoracic spinal cord to the lowest imaged level, at T2-3.  Marrow signal and vertebral body heights are normal.  Slight retrolisthesis at C5-6 is stable.  There is some straightening of the normal cervical lordosis.  Flow is present in the vertebral arteries.  The visualized soft tissues are unremarkable.  C2-3:  Negative.  C3-4:  A shallow central disc protrusion is present without  significant stenosis.  C4-5:  A shallow left paracentral protrusion is new.  This could contact ventral surface of the cord without significant cord distortion or abnormal signal.  C5-6:  Mild broad-based disc osteophyte complex is asymmetric to the left.  Moderate left and mild right foraminal narrowing has progressed.  There is no significant distortion or abnormal signal in the cord.  C6-7:  Negative.  C7-T1:  Negative.  The postcontrast images demonstrate no pathologic enhancement.  IMPRESSION:  1.  New shallow left paracentral protrusion at C4-5 with potential contact of the cord but no abnormal cord signal. 2.  Progressive leftward disc osteophyte complex at C5-6 with moderate left mild right foraminal stenosis.  MRI THORACIC SPINE  Findings: Normal signal is present throughout the thoracic spinal cord.  Conus medullaris terminates at L1.  Marrow signal, vertebral body heights, alignment are normal.  No significant disc herniations or focal stenosis is present.  The foramina are present bilaterally.  The postcontrast images demonstrate no areas of pathologic enhancement.  Limited imaging of the abdomen is unremarkable.  IMPRESSION: Negative MRI of the thoracic spine.   Original Report Authenticated By: Marin Roberts, M.D.      Discharge Exam: Blood pressure 105/69, pulse 65, temperature 98.5 F (36.9 C), temperature source Oral, resp. rate 16, height 5\' 3"  (1.6 m), weight 63.5 kg (139 lb 15.9 oz), last menstrual period 09/07/2012, SpO2 97.00%. General appearance: alert and cooperative Resp: clear to auscultation bilaterally Neurologic: Cranial nerves: normal, except for weakness in the corner of her mouth on  the left Motor: grossly normal Gait: Normal  Disposition: 01-Home or Self Care     Medication List         citalopram 10 MG tablet  Commonly known as:  CELEXA  Take 10 mg by mouth daily.     hydroxychloroquine 200 MG tablet  Commonly known as:  PLAQUENIL  Take 200 mg by  mouth daily.         SignedRenford Dills D 09/12/2012, 2:55 PM

## 2012-09-12 NOTE — ED Notes (Signed)
Flow manager contacted regarding delay in pt admission, Pt updated

## 2012-09-12 NOTE — H&P (Signed)
[facility] History and Physical  JAQUITTA DUPRIEST WUJ:[id number] DOB: 01-Jul-1975 DOA: 09/11/2012  Referring physician: ED PCP: Katy Apo, MD  Chief Complaint: L sided weakness, L hand tremor  HPI: Lori Mercado is a 37 y.o. female who presents with recurrent L sided weakness and L sided hand tremor.  These symptoms onset gradually starting yesterday, and are associated with L facial droop and slurred speech.  Of note this is the 3rd episode of these symptoms since July of this year but lasting longer this time than usual.  She has been seen by neurology here in the ED in the past and this syndrome was felt to be functional.  MRI brain was obtained in the ED which demonstrates a solitary hyperintensity R frontal parietal white matter unchanged from 2013.  Neurology was consulted and Dr. Leroy Kennedy feels that this is likely functional but at the patients insistence agreed to admit the patient to medicine for MRI spine.  Review of Systems: 12 systems reviewed and otherwise negative.  Past Medical History  Diagnosis Date  . IBS (irritable bowel syndrome)   . Reflux   . Migraine   . Asthma   . 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 Cesarean section  2005    twins  . Endometrial ablation  06/2010    her option   Social History:  reports that she has never smoked. She does not have any smokeless tobacco history on file. She reports that  drinks alcohol. She reports that she does not use illicit drugs.   Allergies  Allergen Reactions  . Aspirin Other (See Comments)    "Asthma"  . Fruit & Vegetable Daily [Nutritional Supplements] Anaphylaxis  . Other Anaphylaxis    Tree nuts    Family History  Problem Relation Age of Onset  . Hypertension Father   . Arthritis Mother   . Multiple sclerosis Maternal Aunt   . Osteoarthritis Maternal Grandmother   .  Heart attack Maternal Grandfather   . Stroke Paternal Grandmother   . Stroke Paternal Grandfather   . Lupus Cousin     Prior to Admission medications   Medication Sig Start Date End Date Taking? Authorizing Provider  citalopram (CELEXA) 10 MG tablet Take 10 mg by mouth daily.   Yes Historical Provider, MD  hydroxychloroquine (PLAQUENIL) 200 MG tablet Take 200 mg by mouth daily.    Yes Historical Provider, MD   Physical Exam: Filed Vitals:   09/11/12 2300  BP: 105/70  Pulse: 69  Temp:   Resp: 11    General:  NAD, resting comfortably in bed Eyes: PEERLA EOMI ENT: mucous membranes moist Neck: supple w/o JVD Cardiovascular: RRR w/o MRG Respiratory: CTA B Abdomen: soft, nt, nd, bs+ Skin: no rash nor lesion Musculoskeletal: MAE, full ROM all 4 extremities Psychiatric: normal tone and affect Neurologic: AAOx3, question L facial weakness, L arm weakness due to lack of effort  Labs on Admission:  Basic Metabolic Panel:  Recent Labs Lab 09/11/12 1349  NA 137  K 3.6  CL 102  CO2 25  GLUCOSE 91  BUN 11  CREATININE 0.76  CALCIUM 9.3   Liver Function Tests:  Recent Labs Lab 09/11/12 1349  AST 19  ALT 21  ALKPHOS 66  BILITOT 0.5  PROT 7.2  ALBUMIN 3.9   No results found for this basename: LIPASE,  AMYLASE,  in the last 168 hours No results found for this basename: AMMONIA,  in the last 168 hours CBC:  Recent Labs Lab 09/11/12 1349  WBC 3.8*  NEUTROABS 2.2  HGB 13.7  HCT 39.6  MCV 79.5  PLT 206   Cardiac Enzymes:  Recent Labs Lab 09/11/12 1349  TROPONINI <0.30    BNP (last 3 results) No results found for this basename: PROBNP,  in the last 8760 hours CBG:  Recent Labs Lab 09/11/12 1346 09/11/12 1745  GLUCAP 102* 90    Radiological Exams on Admission: Mr Brain Wo Contrast  09/11/2012   *RADIOLOGY REPORT*  Clinical Data: Left-sided weakness  MRI HEAD WITHOUT CONTRAST  Technique:  Multiplanar, multiecho pulse sequences of the brain and  surrounding structures were obtained according to standard protocol without intravenous contrast.  Comparison: CT 07/04/2012.  MRI 12/13/2011  Findings: Solitary 4 mm hyperintensity right frontal parietal white matter unchanged from 2013.  Remainder of  the white matter is normal.  Basal ganglia,  brainstem are normal.  Negative for acute infarct.  Negative for hemorrhage or mass lesion.  Ventricle size is normal.  No midline shift.  Paranasal sinuses are clear.  IMPRESSION: No acute abnormality and no change from 12/13/2011.  Solitary hyperintensity in the right frontal parietal white matter.  This may be due to chronic microvascular ischemia, migraine headache, and less likely demyelinating disease.   Original Report Authenticated By: Janeece Riggers, M.D.    EKG: Independently reviewed.  Assessment/Plan Principal Problem:   Left-sided weakness Active Problems:   Facial weakness   1. LUE and facial weakness - recurrent, with negative MRI brain, after clinical evaluation by neurology this is felt to be functional with patient giving questionable effort on LUE weakness on their exam.  Admitting at their request for MRI C and T spine to look for cord lesion although this is low yield and the likely hood of demyelinating disorder is felt to be "extremely low" at this point.    Code Status: Full Code (must indicate code status--if unknown or must be presumed, indicate so) Family Communication: No family in room (indicate person spoken with, if applicable, with phone number if by telephone) Disposition Plan: Admit to obs (indicate anticipated LOS)  Time spent: 50 min  Kira Hartl M. [facility] Pager [phone]  If 7PM-7AM, please contact night-coverage [email or web address] Password [email or web address] 09/12/2012, 1:12 AM

## 2012-09-12 NOTE — Progress Notes (Signed)
Subjective: Patient continues to have some paresthesias on her face and left hand but they are improved.  There is jerking that continues intermittently in her left pinky.    Objective: Current vital signs: BP 105/69  Pulse 65  Temp(Src) 98.5 F (36.9 C) (Oral)  Resp 16  Ht 5\' 3"  (1.6 m)  Wt 63.5 kg (139 lb 15.9 oz)  BMI 24.8 kg/m2  SpO2 97%  LMP 09/07/2012 Vital signs in last 24 hours: Temp:  [98.1 F (36.7 C)-98.8 F (37.1 C)] 98.5 F (36.9 C) (09/02 1350) Pulse Rate:  [61-83] 65 (09/02 1350) Resp:  [11-29] 16 (09/02 1350) BP: (99-124)/(54-84) 105/69 mmHg (09/02 1350) SpO2:  [96 %-100 %] 97 % (09/02 1350) Weight:  [63.5 kg (139 lb 15.9 oz)] 63.5 kg (139 lb 15.9 oz) (09/02 0202)  Intake/Output from previous day:   Intake/Output this shift:   Nutritional status: General  Neurologic Exam: Mental Status:  Alert, oriented, thought content appropriate. Speech fluent without evidence of aphasia. Able to follow 3 step commands without difficulty.  Cranial Nerves:  II: Discs flat bilaterally; Visual fields grossly normal, pupils equal, round, reactive to light and accommodation  III,IV, VI: ptosis not present, extra-ocular motions intact bilaterally  V: facial light touch sensation normal bilaterally  VII: speaks with symmetric movement of the face VIII: hearing normal bilaterally  IX,X: gag reflex present  XI: bilateral shoulder shrug  XII: midline tongue extension  Motor:  Moves all extremities antigravity Sensory: Pinprick and light touch intact throughout, bilaterally  Deep Tendon Reflexes:  2+ throughout   Lab Results: Basic Metabolic Panel:  Recent Labs Lab 09/11/12 1349  NA 137  K 3.6  CL 102  CO2 25  GLUCOSE 91  BUN 11  CREATININE 0.76  CALCIUM 9.3    Liver Function Tests:  Recent Labs Lab 09/11/12 1349  AST 19  ALT 21  ALKPHOS 66  BILITOT 0.5  PROT 7.2  ALBUMIN 3.9   No results found for this basename: LIPASE, AMYLASE,  in the last 168  hours No results found for this basename: AMMONIA,  in the last 168 hours  CBC:  Recent Labs Lab 09/11/12 1349  WBC 3.8*  NEUTROABS 2.2  HGB 13.7  HCT 39.6  MCV 79.5  PLT 206    Cardiac Enzymes:  Recent Labs Lab 09/11/12 1349  TROPONINI <0.30    Lipid Panel: No results found for this basename: CHOL, TRIG, HDL, CHOLHDL, VLDL, LDLCALC,  in the last 168 hours  CBG:  Recent Labs Lab 09/11/12 1346 09/11/12 1745  GLUCAP 102* 90    Microbiology: Results for orders placed during the hospital encounter of 06/07/07  URINE CULTURE     Status: None   Collection Time    06/07/07 10:24 PM      Result Value Range Status   Specimen Description URINE, CLEAN CATCH   Final   Special Requests ADDED [id number]   Final   Colony Count 40,000 COLONIES/ML   Final   Culture ENTEROCOCCUS SPECIES   Final   Report Status 06/11/2007 FINAL   Final   Organism ID, Bacteria ENTEROCOCCUS SPECIES   Final    Coagulation Studies:  Recent Labs  09/11/12 1349  LABPROT 12.9  INR 0.99    Imaging: Mr Brain Wo Contrast  09/11/2012   *RADIOLOGY REPORT*  Clinical Data: Left-sided weakness  MRI HEAD WITHOUT CONTRAST  Technique:  Multiplanar, multiecho pulse sequences of the brain and surrounding structures were obtained according to standard protocol without  intravenous contrast.  Comparison: CT 07/04/2012.  MRI 12/13/2011  Findings: Solitary 4 mm hyperintensity right frontal parietal white matter unchanged from 2013.  Remainder of  the white matter is normal.  Basal ganglia,  brainstem are normal.  Negative for acute infarct.  Negative for hemorrhage or mass lesion.  Ventricle size is normal.  No midline shift.  Paranasal sinuses are clear.  IMPRESSION: No acute abnormality and no change from 12/13/2011.  Solitary hyperintensity in the right frontal parietal white matter.  This may be due to chronic microvascular ischemia, migraine headache, and less likely demyelinating disease.   Original Report  Authenticated By: Janeece Riggers, M.D.   Mr Cervical Spine W Wo Contrast  09/12/2012   *RADIOLOGY REPORT*  Clinical Data:  Recurrent left-sided weakness and left and tremor. Left facial droop and slurred speech.  The patient has had additional similar episodes recently.  MRI CERVICAL AND THORACIC SPINE WITHOUT AND WITH CONTRAST  Technique:  Multiplanar and multiecho pulse sequences of the cervical spine, to include the craniocervical junction and cervicothoracic junction, and the thoracic spine, were obtained without and with intravenous contrast.  Contrast: 12mL MULTIHANCE GADOBENATE DIMEGLUMINE 529 MG/ML IV SOLN  Comparison:  MRI brain 09/11/2012.  MRI cervical spine 02/26/2011.  MRI CERVICAL SPINE  Findings:  Normal signal is present in the cervical upper thoracic spinal cord to the lowest imaged level, at T2-3.  Marrow signal and vertebral body heights are normal.  Slight retrolisthesis at C5-6 is stable.  There is some straightening of the normal cervical lordosis.  Flow is present in the vertebral arteries.  The visualized soft tissues are unremarkable.  C2-3:  Negative.  C3-4:  A shallow central disc protrusion is present without significant stenosis.  C4-5:  A shallow left paracentral protrusion is new.  This could contact ventral surface of the cord without significant cord distortion or abnormal signal.  C5-6:  Mild broad-based disc osteophyte complex is asymmetric to the left.  Moderate left and mild right foraminal narrowing has progressed.  There is no significant distortion or abnormal signal in the cord.  C6-7:  Negative.  C7-T1:  Negative.  The postcontrast images demonstrate no pathologic enhancement.  IMPRESSION:  1.  New shallow left paracentral protrusion at C4-5 with potential contact of the cord but no abnormal cord signal. 2.  Progressive leftward disc osteophyte complex at C5-6 with moderate left mild right foraminal stenosis.  MRI THORACIC SPINE  Findings: Normal signal is present throughout  the thoracic spinal cord.  Conus medullaris terminates at L1.  Marrow signal, vertebral body heights, alignment are normal.  No significant disc herniations or focal stenosis is present.  The foramina are present bilaterally.  The postcontrast images demonstrate no areas of pathologic enhancement.  Limited imaging of the abdomen is unremarkable.  IMPRESSION: Negative MRI of the thoracic spine.   Original Report Authenticated By: Marin Roberts, M.D.   Mr Thoracic Spine W Wo Contrast  09/12/2012   *RADIOLOGY REPORT*  Clinical Data:  Recurrent left-sided weakness and left and tremor. Left facial droop and slurred speech.  The patient has had additional similar episodes recently.  MRI CERVICAL AND THORACIC SPINE WITHOUT AND WITH CONTRAST  Technique:  Multiplanar and multiecho pulse sequences of the cervical spine, to include the craniocervical junction and cervicothoracic junction, and the thoracic spine, were obtained without and with intravenous contrast.  Contrast: 12mL MULTIHANCE GADOBENATE DIMEGLUMINE 529 MG/ML IV SOLN  Comparison:  MRI brain 09/11/2012.  MRI cervical spine 02/26/2011.  MRI CERVICAL SPINE  Findings:  Normal signal is present in the cervical upper thoracic spinal cord to the lowest imaged level, at T2-3.  Marrow signal and vertebral body heights are normal.  Slight retrolisthesis at C5-6 is stable.  There is some straightening of the normal cervical lordosis.  Flow is present in the vertebral arteries.  The visualized soft tissues are unremarkable.  C2-3:  Negative.  C3-4:  A shallow central disc protrusion is present without significant stenosis.  C4-5:  A shallow left paracentral protrusion is new.  This could contact ventral surface of the cord without significant cord distortion or abnormal signal.  C5-6:  Mild broad-based disc osteophyte complex is asymmetric to the left.  Moderate left and mild right foraminal narrowing has progressed.  There is no significant distortion or abnormal  signal in the cord.  C6-7:  Negative.  C7-T1:  Negative.  The postcontrast images demonstrate no pathologic enhancement.  IMPRESSION:  1.  New shallow left paracentral protrusion at C4-5 with potential contact of the cord but no abnormal cord signal. 2.  Progressive leftward disc osteophyte complex at C5-6 with moderate left mild right foraminal stenosis.  MRI THORACIC SPINE  Findings: Normal signal is present throughout the thoracic spinal cord.  Conus medullaris terminates at L1.  Marrow signal, vertebral body heights, alignment are normal.  No significant disc herniations or focal stenosis is present.  The foramina are present bilaterally.  The postcontrast images demonstrate no areas of pathologic enhancement.  Limited imaging of the abdomen is unremarkable.  IMPRESSION: Negative MRI of the thoracic spine.   Original Report Authenticated By: Marin Roberts, M.D.    Medications:  I have reviewed the patient's current medications. Scheduled: . citalopram  10 mg Oral Daily  . heparin  5,000 Units Subcutaneous Q8H  . hydroxychloroquine  200 mg Oral Daily  . pantoprazole  40 mg Oral Daily    Assessment/Plan: Patient's symptoms continue although improved.  MRI of the cervical spine reviewed and shows a left disc protrusion at C4-5.  Although it seems to abut the cord there does not appear to be compression of the cord.  I do not feel this is causing her symptoms.  MRI of the thoracic spine is normal.  Results explained to patient and wife.  May benefit from further testing on an outpatient basis.    Recommendations: 1.  NCV/EMG as an outpatient  Case discussed with Dr. Nehemiah Settle   LOS: 1 day   Thana Farr, MD [facility] [phone] 09/12/2012  2:40 PM

## 2012-09-12 NOTE — Progress Notes (Signed)
PT d/c ed to home, given all education and paper works. Lori Mercado

## 2012-10-12 ENCOUNTER — Ambulatory Visit (INDEPENDENT_AMBULATORY_CARE_PROVIDER_SITE_OTHER): Payer: BC Managed Care – PPO | Admitting: Gynecology

## 2012-10-12 ENCOUNTER — Encounter: Payer: Self-pay | Admitting: Gynecology

## 2012-10-12 VITALS — BP 118/74

## 2012-10-12 DIAGNOSIS — N6310 Unspecified lump in the right breast, unspecified quadrant: Secondary | ICD-10-CM

## 2012-10-12 DIAGNOSIS — N63 Unspecified lump in unspecified breast: Secondary | ICD-10-CM

## 2012-10-12 DIAGNOSIS — R51 Headache: Secondary | ICD-10-CM

## 2012-10-12 DIAGNOSIS — N643 Galactorrhea not associated with childbirth: Secondary | ICD-10-CM

## 2012-10-12 NOTE — Patient Instructions (Addendum)
Breast Cyst A breast cyst is a sac in your breast that is filled with fluid. This is common in women. Women can have one or many cysts. When the breasts contain many cysts, it is usually due to a noncancerous (benign) condition called fibrocystic change. These lumps form under the influence of female hormones (estrogen and progesterone). The lumps are most often located in the upper, outer portion of the breast. They are often more swollen, painful, and tender before your period starts. They usually disappear after menopause, unless you are on hormone therapy. Different types of cysts:  Macrocysts. These are cysts that are about 2 inches (5.1 cm) in diameter.  Microcysts. These are tiny cysts that you cannot feel, but that are seen with a mammogram or an ultrasound.  Galactocele. This is a cyst containing milk, which develops when and if you suddenly stop breastfeeding.  Sebaceous cyst of the skin (not in the breast tissue itself). These are not cancerous. Breast cysts do not increase your chance of getting breast cancer. However, they must be followed and treated closely, because a cyst can be cancerous. Be sure to see your caregiver for follow-up care as recommended.  CAUSES   It is not completely known what causes a breast cyst.  Estrogen may influence the development of a breast cyst.  An overgrowth of milk glands and connective tissue in the breast can block the milk glands, causing them to fill with fluid.  Scar tissue in the breast from previous surgery may block the glands, causing a cyst. SYMPTOMS   Feeling a smooth, round, soft lump (like a grape) in the breast that is easily moveable.  Breast discomfort or pain, especially in the area of the cyst.  Increase in size of the lump before your menstrual period, and decrease in size after your menstrual period. DIAGNOSIS   The cyst can be felt during an exam by your caregiver.  Mammogram (breast X-ray).  Ultrasound.  Removing  fluid from the cyst with a needle (fine needle aspiration). TREATMENT   Your caregiver may feel there is no reason for treatment. He or she may watch to see if it goes away on its own.  Hormone treatment.  Needle aspiration. There is a 40% chance of the cyst recurring after aspiration.  Surgery to remove the whole cyst. HOME CARE INSTRUCTIONS   Get a yearly exam by your caregiver.  Practice "breast self-awareness." This means understanding the normal appearance and feel of your breasts and may include breast self-examination.  Have a clinical breast exam (CBE) by a caregiver every 1 to 3 years if you are 20 to 37 years of age. After age 40, you should have a CBE every year.  Get mammogram tests as directed by your caregiver.  Only take over-the-counter pain medicine as directed by your caregiver.  Wear a good support bra, especially when exercising.  Avoid caffeine.  Reduce your salt intake, especially before your menstrual period. Too much salt can cause fluid retention, breast swelling, and discomfort. SEEK MEDICAL CARE IF:   You feel, or think you feel, a lump in your breast.  You notice that both breasts look different than usual.  You notice that both breasts feel different than before.  Your breast is still causing pain, after your menstrual period is over.  You need medicine for breast pain and swelling that occurs with your menstrual period. SEEK IMMEDIATE MEDICAL CARE IF:   You develop severe pain, tenderness, redness, or warmth in your   breast.  You develop nipple discharge or bleeding.  Your breast lump becomes hard and painful.  You find new lumps or bumps that were not there before.  You feel lumps in your armpit (axilla).  You notice dimpling or wrinkling of the breast or nipple.  You have a fever. Document Released: 12/28/2004 Document Revised: 03/22/2011 Document Reviewed: 04/19/2008 [employer] Patient Information 2014 [employer].  

## 2012-10-12 NOTE — Progress Notes (Signed)
Patient presented to the office today stating that this morning she felt a breast lump and noticed some clear to brownish white discharge from her nipple. Her husband has had a vasectomy. She is only taking Plaquenil and Celexa. She is on her detail and her of her menstrual cycle today. She denies any recent trauma or injury to the breast. Patient with no first-line relative of breast cancer. Patient was also having some headaches but no double vision.  Exam: Both breasts are examined sitting supine position and there appeared to be symmetrical in appearance no skin discoloration of nipple inversion. It was no palpable masses on the left breast. Right breasts 3 finger breaths from the areolar tissue there was a 1/2 cm tender mobile nodule noted. On applying pressure to the areolar area white discharge extruded from the nipple. There was no supraclavicular axillary lymphadenopathy.  Assessment/plan: Unilateral galactorrhea small cystic-like structure 12:00 position 3 fingerbreadths from the areolar tissue. No supraclavicular or axillary lymphadenopathy. Possibility of underlying intraductal papilloma or simple cyst. We are going to check her serum prolactin level today and schedule an ultrasound of the breast and diagnostic mammogram and await interpretation.

## 2012-10-13 ENCOUNTER — Telehealth: Payer: Self-pay | Admitting: *Deleted

## 2012-10-13 DIAGNOSIS — N6310 Unspecified lump in the right breast, unspecified quadrant: Secondary | ICD-10-CM

## 2012-10-13 LAB — PROLACTIN: Prolactin: 15.5 ng/mL

## 2012-10-13 NOTE — Telephone Encounter (Signed)
Appointment 10/25/12 @ 3:00 pm

## 2012-10-13 NOTE — Telephone Encounter (Signed)
Orders placed for the below note at the breast center.

## 2012-10-13 NOTE — Telephone Encounter (Signed)
Message copied by Aura Camps on Fri Oct 13, 2012  8:48 AM ------      Message from: [name]      Created: Thu Oct 12, 2012  3:15 PM       Victorino Dike: Please schedule right breast ultrasound and diagnostic mammogram for this patient with a right breast mass 3 finger breaths from the area alert tissue at the 12:00 position and unilateral galactorrhea.       ------

## 2012-10-25 ENCOUNTER — Ambulatory Visit
Admission: RE | Admit: 2012-10-25 | Discharge: 2012-10-25 | Disposition: A | Discharge status: Home / Self Care | Payer: BC Managed Care – PPO | Source: Ambulatory Visit | Attending: Gynecology | Admitting: Gynecology

## 2012-10-25 DIAGNOSIS — N6310 Unspecified lump in the right breast, unspecified quadrant: Secondary | ICD-10-CM

## 2012-11-14 ENCOUNTER — Encounter: Payer: Self-pay | Admitting: Gynecology

## 2012-11-16 ENCOUNTER — Other Ambulatory Visit (HOSPITAL_COMMUNITY)
Admission: RE | Admit: 2012-11-16 | Discharge: 2012-11-16 | Disposition: A | Discharge status: Home / Self Care | Payer: BC Managed Care – PPO | Source: Ambulatory Visit | Attending: Gynecology | Admitting: Gynecology

## 2012-11-16 ENCOUNTER — Encounter: Payer: Self-pay | Admitting: Gynecology

## 2012-11-16 ENCOUNTER — Other Ambulatory Visit: Payer: Self-pay

## 2012-11-16 ENCOUNTER — Ambulatory Visit (INDEPENDENT_AMBULATORY_CARE_PROVIDER_SITE_OTHER): Payer: BC Managed Care – PPO | Admitting: Gynecology

## 2012-11-16 VITALS — BP 116/78 | Ht 64.0 in | Wt 136.0 lb

## 2012-11-16 DIAGNOSIS — Z01419 Encounter for gynecological examination (general) (routine) without abnormal findings: Secondary | ICD-10-CM

## 2012-11-16 DIAGNOSIS — Z8639 Personal history of other endocrine, nutritional and metabolic disease: Secondary | ICD-10-CM

## 2012-11-16 DIAGNOSIS — Z1151 Encounter for screening for human papillomavirus (HPV): Secondary | ICD-10-CM | POA: Insufficient documentation

## 2012-11-16 NOTE — Patient Instructions (Signed)
Trigeminal Neuralgia Trigeminal neuralgia is a nerve disorder that causes sudden attacks of severe facial pain. It is caused by damage to the trigeminal nerve, a major nerve in the face. It is more common in women and in the elderly, although it can also happen in younger patients. Attacks last from a few seconds to several minutes and can occur from a couple of times per year to several times per day. Trigeminal neuralgia can be a very distressing and disabling condition. Surgery may be needed in very severe cases if medical treatment does not give relief. HOME CARE INSTRUCTIONS   If your caregiver prescribed medication to help prevent attacks, take as directed.  To help prevent attacks:  Chew on the unaffected side of the mouth.  Avoid touching your face.  Avoid blasts of hot or cold air.  Men may wish to grow a beard to avoid having to shave. SEEK IMMEDIATE MEDICAL CARE IF:  Pain is unbearable and your medicine does not help.  You develop new, unexplained symptoms (problems).  You have problems that may be related to a medication you are taking. Document Released: 12/26/1999 Document Revised: 03/22/2011 Document Reviewed: 10/25/2008 [facility] Patient Information 2014 [place], [place].   Bell's Palsy Bell's palsy is a condition in which the muscles on one side of the face cannot move (paralysis). This is because the nerves in the face are paralyzed. It is most often thought to be caused by a virus. The virus causes swelling of the nerve that controls movement on one side of the face. The nerve travels through a tight space surrounded by bone. When the nerve swells, it can be compressed by the bone. This results in damage to the protective covering around the nerve. This damage interferes with how the nerve communicates with the muscles of the face. As a result, it can cause weakness or paralysis of the facial muscles.  Injury (trauma), tumor, and surgery may cause Bell's palsy, but most  of the time the cause is unknown. It is a relatively common condition. It starts suddenly (abrupt onset) with the paralysis usually ending within 2 days. Bell's palsy is not dangerous. But because the eye does not close properly, you may need care to keep the eye from getting dry. This can include splinting (to keep the eye shut) or moistening with artificial tears. Bell's palsy very seldom occurs on both sides of the face at the same time. SYMPTOMS   Eyebrow sagging.  Drooping of the eyelid and corner of the mouth.  Inability to close one eye.  Loss of taste on the front of the tongue.  Sensitivity to loud noises. TREATMENT  The treatment is usually non-surgical. If the patient is seen within the first 24 to 48 hours, a short course of steroids may be prescribed, in an attempt to shorten the length of the condition. Antiviral medicines may also be used with the steroids, but it is unclear if they are helpful.  You will need to protect your eye, if you cannot close it. The cornea (clear covering over your eye) will become dry and can be damaged. Artificial tears can be used to keep your eye moist. Glasses or an eye patch should be worn to protect your eye. PROGNOSIS  Recovery is variable, ranging from days to months. Although the problem usually goes away completely (about 80% of cases resolve), predicting the outcome is impossible. Most people improve within 3 weeks of when the symptoms began. Improvement may continue for 3 to 6  months. A small number of people have moderate to severe weakness that is permanent.  HOME CARE INSTRUCTIONS   If your caregiver prescribed medication to reduce swelling in the nerve, use as directed. Do not stop taking the medication unless directed by your caregiver.  Use moisturizing eye drops as needed to prevent drying of your eye, as directed by your caregiver.  Protect your eye, as directed by your caregiver.  Use facial massage and exercises, as directed by  your caregiver.  Perform your normal activities, and get your normal rest. SEEK IMMEDIATE MEDICAL CARE IF:   There is pain, redness or irritation in the eye.  You or your child has an oral temperature above 102 F (38.9 C), not controlled by medicine. MAKE SURE YOU:   Understand these instructions.  Will watch your condition.  Will get help right away if you are not doing well or get worse. Document Released: 12/28/2004 Document Revised: 03/22/2011 Document Reviewed: 01/06/2009 [facility] Patient Information 2014 [place], [place].

## 2012-11-16 NOTE — Progress Notes (Signed)
Lori Mercado Apr 14, 1975 [id number]   History:    37 y.o.  for annual gyn exam with the complaints of the past 2 weeks she's been having headaches 3 times a week on her left side of her face. On further questioning she states that she has been to the emergency room several times as well as seeing her primary physician because she has had up to 10 episodes of Bell's palsy this year? She was also seen here in the office on October 2 with a complaint of unilateral galactorrhea and there was a questionable small cystic area at the 12:00 position 3 fingerbreadths from the areolar tissue and she was sent for diagnostic mammogram and ultrasound which were both negative. Her prolactin level was normal. Patient's last Pap smear 2011 no prior history of abnormal Pap smear. Patient has had a prior history of endometrial ablation and is having normal cycles. She also has been treated in the past for vitamin D deficiency. Her husband has had a vasectomy.  Past medical history,surgical history, family history and social history were all reviewed and documented in the EPIC chart.  Gynecologic History Patient's last menstrual period was 11/07/2012. Contraception: vasectomy Last Pap: 2011. Results were: normal Last mammogram: see above. Results were: see above  Obstetric History OB History  Gravida Para Term Preterm AB SAB TAB Ectopic Multiple Living  1 1       1 2     # Outcome Date GA Lbr Len/2nd Weight Sex Delivery Anes PTL Lv  1 PAR                ROS: A ROS was performed and pertinent positives and negatives are included in the history.  GENERAL: No fevers or chills. HEENT: No change in vision, no earache, sore throat or sinus congestion. NECK: No pain or stiffness. CARDIOVASCULAR: No chest pain or pressure. No palpitations. PULMONARY: No shortness of breath, cough or wheeze. GASTROINTESTINAL: No abdominal pain, nausea, vomiting or diarrhea, melena or bright red blood per rectum. GENITOURINARY: No  urinary frequency, urgency, hesitancy or dysuria. MUSCULOSKELETAL: No joint or muscle pain, no back pain, no recent trauma. DERMATOLOGIC: No rash, no itching, no lesions. ENDOCRINE: No polyuria, polydipsia, no heat or cold intolerance. No recent change in weight. HEMATOLOGICAL: No anemia or easy bruising or bleeding. NEUROLOGIC: No headache, seizures, numbness, tingling or weakness. PSYCHIATRIC: No depression, no loss of interest in normal activity or change in sleep pattern.     Exam: chaperone present  BP 116/78  Ht 5\' 4"  (1.626 m)  Wt 136 lb (61.689 kg)  BMI 23.33 kg/m2  LMP 11/07/2012  Body mass index is 23.33 kg/(m^2).  General appearance : Well developed well nourished female. No acute distress HEENT: Neck supple, trachea midline, no carotid bruits, no thyroidmegaly Lungs: Clear to auscultation, no rhonchi or wheezes, or rib retractions  Heart: Regular rate and rhythm, no murmurs or gallops Breast:Examined in sitting and supine position were symmetrical in appearance, no palpable masses or tenderness,  no skin retraction, no nipple inversion, no nipple discharge, no skin discoloration, no axillary or supraclavicular lymphadenopathy Abdomen: no palpable masses or tenderness, no rebound or guarding Extremities: no edema or skin discoloration or tenderness  Pelvic:  Bartholin, Urethra, Skene Glands: Within normal limits             Vagina: No gross lesions or discharge  Cervix: No gross lesions or discharge  Uterus  anteverted, normal size, shape and consistency, non-tender and mobile  Adnexa  Without  masses or tenderness  Anus and perineum  normal   Rectovaginal  normal sphincter tone without palpated masses or tenderness             Hemoccult not done     Assessment/Plan:  37 y.o. female for annual exam with what appears to be trigeminal neuralgia with several episodes of Bell's palsy this year may be the source of her headache. I'm going to refer her to Dr.Lewit neurologist  for further evaluation. She had recent lab work by her primary the only lab work not tested which we will test today her TSH as well as vitamin D level and urinalysis. Her Pap smear was done today. She will return back to the office in 6 months for followup breast exam. Patient refused the flu vaccine. Patient reminded to do her monthly breast exam.  Note: This dictation was prepared with  Dragon/digital dictation along withSmart phrase technology. Any transcriptional errors that result from this process are unintentional.   [name] MD, 4:36 PM 11/16/2012

## 2012-11-17 LAB — URINALYSIS W MICROSCOPIC + REFLEX CULTURE
Bilirubin Urine: NEGATIVE
Casts: NONE SEEN
Glucose, UA: NEGATIVE mg/dL
Hgb urine dipstick: NEGATIVE
Nitrite: NEGATIVE
Protein, ur: NEGATIVE mg/dL
Specific Gravity, Urine: 1.007 (ref 1.005–1.030)
Squamous Epithelial / LPF: NONE SEEN
Urobilinogen, UA: 0.2 mg/dL (ref 0.0–1.0)

## 2012-11-17 LAB — VITAMIN D 25 HYDROXY (VIT D DEFICIENCY, FRACTURES): Vit D, 25-Hydroxy: 29 ng/mL — ABNORMAL LOW (ref 30–89)

## 2012-11-18 LAB — URINE CULTURE: Colony Count: 25000

## 2012-11-21 ENCOUNTER — Encounter: Payer: Self-pay | Admitting: Gynecology

## 2012-11-21 ENCOUNTER — Ambulatory Visit: Payer: BC Managed Care – PPO | Admitting: Women's Health

## 2012-11-28 ENCOUNTER — Ambulatory Visit (INDEPENDENT_AMBULATORY_CARE_PROVIDER_SITE_OTHER): Payer: BC Managed Care – PPO | Admitting: Gynecology

## 2012-11-28 ENCOUNTER — Encounter: Payer: Self-pay | Admitting: Gynecology

## 2012-11-28 VITALS — BP 110/72

## 2012-11-28 DIAGNOSIS — N930 Postcoital and contact bleeding: Secondary | ICD-10-CM

## 2012-11-28 DIAGNOSIS — B9689 Other specified bacterial agents as the cause of diseases classified elsewhere: Secondary | ICD-10-CM

## 2012-11-28 DIAGNOSIS — N760 Acute vaginitis: Secondary | ICD-10-CM

## 2012-11-28 DIAGNOSIS — A499 Bacterial infection, unspecified: Secondary | ICD-10-CM

## 2012-11-28 DIAGNOSIS — B373 Candidiasis of vulva and vagina: Secondary | ICD-10-CM

## 2012-11-28 MED ORDER — METRONIDAZOLE 0.75 % VA GEL: 1.0000 | Freq: Two times a day (BID) | VAGINAL | Status: DC

## 2012-11-28 MED ORDER — FLUCONAZOLE 150 MG PO TABS: 150.0000 mg | ORAL_TABLET | Freq: Once | ORAL | Status: DC

## 2012-11-28 NOTE — Patient Instructions (Signed)
Cervicitis °Cervicitis is a soreness and swelling (inflammation) of the cervix. Your cervix is located at the bottom of your uterus. It opens up to the vagina. °CAUSES  °· Sexually transmitted infections (STIs).   °· Allergic reaction.   °· Medicines or birth control devices that are put in the vagina.   °· Injury to the cervix.   °· Bacterial infections.   °RISK FACTORS °You are at greater risk if you: °· Have unprotected sexual intercourse. °· Have sexual intercourse with many partners. °· Began sexual intercourse at an early age. °· Have a history of STIs. °SYMPTOMS  °There may be no symptoms. If symptoms occur, they may include:  °· Grey, white, yellow, or bad-smelling vaginal discharge.   °· Pain or itching of the area outside the vagina.   °· Painful sexual intercourse.   °· Lower abdominal or lower back pain, especially during intercourse.   °· Frequent urination.   °· Abnormal vaginal bleeding between periods, after sexual intercourse, or after menopause.   °· Pressure or a heavy feeling in the pelvis.   °DIAGNOSIS  °Diagnosis is made after a pelvic exam. Other tests may include:  °· Examination of any discharge under a microscope (wet prep).   °· A Pap test.   °TREATMENT  °Treatment will depend on the cause of cervicitis. If it is caused by an STI, both you and your partner will need to be treated. Antibiotic medicines will be given.  °HOME CARE INSTRUCTIONS  °· Do not have sexual intercourse until your health care provider says it is okay.   °· Do not have sexual intercourse until your partner has been treated, if your cervicitis is caused by an STI.   °· Take your antibiotics as directed. Finish them even if you start to feel better.   °SEEK MEDICAL CARE IF: °· Your symptoms come back.   °· You have a fever.   °MAKE SURE YOU:  °· Understand these instructions. °· Will watch your condition. °· Will get help right away if you are not doing well or get worse. °Document Released: 12/28/2004 Document Revised:  08/30/2012 Document Reviewed: 06/21/2012 °[employer]® Patient Information ©2014 [employer]. ° °

## 2012-11-28 NOTE — Progress Notes (Signed)
37 year old who presented to the office today complaining of postcoital bleeding for the past few weeks. Patient stated that the week before she was seen in the office on November 6 for her annual exam was the first time she had experienced at as well as after her Pap smear. Her Pap smear on that date was normal. Patient does not douche. Patient denies any discharge of any color other than the postcoital bleeding. Patient is married in a monogamous relationship and her partner has had a vasectomy.  Exam: Bartholin urethra Skene glands within normal limits Vagina: No lesions or discharge Cervix slightly friable one small vessel was noted at the 6:00 position of the ectocervix Bimanual exam: Not done rectal exam: Not done  Wet prep: Few yeast many white blood cells and many bacteria  Assessment/plan: Cervicitis contributing to postcoital bleeding probably be the, yeast combination infection. Patient will be prescribed Diflucan 150 mg to take one by mouth today. She'll be placed on MetroGel to apply vaginally twice a day for one week and to abstain her course during that week. Silver nitrate was applied to the area where the small vessel was at the ectocervix and some of the friable areas.

## 2012-11-29 LAB — GC/CHLAMYDIA PROBE AMP: GC Probe RNA: NEGATIVE

## 2013-03-13 ENCOUNTER — Other Ambulatory Visit: Payer: Self-pay | Admitting: Internal Medicine

## 2013-03-13 DIAGNOSIS — M545 Low back pain, unspecified: Secondary | ICD-10-CM

## 2013-03-17 ENCOUNTER — Ambulatory Visit
Admission: RE | Admit: 2013-03-17 | Discharge: 2013-03-17 | Disposition: A | Discharge status: Home / Self Care | Payer: BC Managed Care – PPO | Source: Ambulatory Visit | Attending: Internal Medicine | Admitting: Internal Medicine

## 2013-03-17 DIAGNOSIS — M545 Low back pain, unspecified: Secondary | ICD-10-CM

## 2013-11-04 ENCOUNTER — Encounter: Payer: Self-pay | Admitting: *Deleted

## 2013-11-12 ENCOUNTER — Encounter: Payer: Self-pay | Admitting: *Deleted

## 2013-11-26 ENCOUNTER — Other Ambulatory Visit (HOSPITAL_COMMUNITY)
Admission: RE | Admit: 2013-11-26 | Discharge: 2013-11-26 | Disposition: A | Discharge status: Home / Self Care | Payer: BC Managed Care – PPO | Source: Ambulatory Visit | Attending: Gynecology | Admitting: Gynecology

## 2013-11-26 ENCOUNTER — Encounter: Payer: Self-pay | Admitting: Gynecology

## 2013-11-26 ENCOUNTER — Ambulatory Visit (INDEPENDENT_AMBULATORY_CARE_PROVIDER_SITE_OTHER): Payer: BC Managed Care – PPO | Admitting: Gynecology

## 2013-11-26 VITALS — BP 128/86 | Ht 63.25 in | Wt 150.0 lb

## 2013-11-26 DIAGNOSIS — N930 Postcoital and contact bleeding: Secondary | ICD-10-CM | POA: Insufficient documentation

## 2013-11-26 DIAGNOSIS — Z8719 Personal history of other diseases of the digestive system: Secondary | ICD-10-CM

## 2013-11-26 DIAGNOSIS — Z01419 Encounter for gynecological examination (general) (routine) without abnormal findings: Secondary | ICD-10-CM | POA: Diagnosis present

## 2013-11-26 DIAGNOSIS — R102 Pelvic and perineal pain: Secondary | ICD-10-CM | POA: Insufficient documentation

## 2013-11-26 DIAGNOSIS — Z8639 Personal history of other endocrine, nutritional and metabolic disease: Secondary | ICD-10-CM

## 2013-11-26 NOTE — Progress Notes (Signed)
Lori Mercado 03-14-1975 [id number]   History:    38 y.o.  for annual gyn exam with a complaint of on and off right lower quadrant pain for the past 4 months. Sometimes they feel sharp last 3-4 minutes. Patient sometimes feel bloated suprapubically. She has occasional dyspareunia and has had on and off for the past year postcoital bleeding. Her husband has had a vasectomy. Otherwise she reports normal menstrual cycles and no past history of abnormal Pap smear. Patient was diagnosed with IBS over 20 years ago. She declined flu vaccine today. Patient with normal mammogram 2014. Patient with past history vitamin D deficiency. Her PCP is Dr.Polite who has been doing her blood work. Because of the discomfort the patient is had with the pain radiating to her right lower leg her PCP had ordered a MRI of the spine and hip in March of this year was reported to be normal.  Past medical history,surgical history, family history and social history were all reviewed and documented in the EPIC chart.  Gynecologic History Patient's last menstrual period was 11/16/2013. Contraception: vasectomy Last Pap: 2014. Results were: normal Last mammogram:not indicated. Results were: not indicated  Obstetric History OB History  Gravida Para Term Preterm AB SAB TAB Ectopic Multiple Living  1 1       1 2     # Outcome Date GA Lbr Len/2nd Weight Sex Delivery Anes PTL Lv  1 Para                ROS: A ROS was performed and pertinent positives and negatives are included in the history.  GENERAL: No fevers or chills. HEENT: No change in vision, no earache, sore throat or sinus congestion. NECK: No pain or stiffness. CARDIOVASCULAR: No chest pain or pressure. No palpitations. PULMONARY: No shortness of breath, cough or wheeze. GASTROINTESTINAL: No abdominal pain, nausea, vomiting or diarrhea, melena or bright red blood per rectum. GENITOURINARY: No urinary frequency, urgency, hesitancy or dysuria. MUSCULOSKELETAL: No  joint or muscle pain, no back pain, no recent trauma. DERMATOLOGIC: No rash, no itching, no lesions. ENDOCRINE: No polyuria, polydipsia, no heat or cold intolerance. No recent change in weight. HEMATOLOGICAL: No anemia or easy bruising or bleeding. NEUROLOGIC: No headache, seizures, numbness, tingling or weakness. PSYCHIATRIC: No depression, no loss of interest in normal activity or change in sleep pattern.     Exam: chaperone present  BP 128/86 mmHg  Ht 5' 3.25" (1.607 m)  Wt 150 lb (68.04 kg)  BMI 26.35 kg/m2  LMP 11/16/2013  Body mass index is 26.35 kg/(m^2).  General appearance : Well developed well nourished female. No acute distress HEENT: Neck supple, trachea midline, no carotid bruits, no thyroidmegaly Lungs: Clear to auscultation, no rhonchi or wheezes, or rib retractions  Heart: Regular rate and rhythm, no murmurs or gallops Breast:Examined in sitting and supine position were symmetrical in appearance, no palpable masses or tenderness,  no skin retraction, no nipple inversion, no nipple discharge, no skin discoloration, no axillary or supraclavicular lymphadenopathy Abdomen: no palpable masses or tenderness, no rebound or guarding Extremities: no edema or skin discoloration or tenderness  Pelvic:  Bartholin, Urethra, Skene Glands: Within normal limits             Vagina: No gross lesions or discharge  Cervix: No gross lesions or discharge, friable  Uterus  anteverted, normal size, shape and consistency, non-tender and mobile  Adnexa  Without masses or tenderness  Anus and perineum  normal   Rectovaginal  normal sphincter tone without palpated masses or tenderness             Hemoccult not indicated     Assessment/Plan:  38 y.o. female for annual exam will return back to the office in 1-2 weeks for pelvic ultrasound. Although I did not feel a palpable mass we'll get a better assessment of her ovaries and uterus. If her ultrasound is normal she'll be referred to the  gastroenterologist for further follow-up since she has had history in the past of IBS. Her Pap smear was done today. She'll return back to the office as well to discuss the result of the ultrasound and a result of her Pap smear. 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 Since she has had history in the past vitamin D deficiency in vitamin D level will be obtained today. We discussed importance of monthly breast exams.   Ok EdwardsFERNANDEZ,Eiza Canniff H MD, 5:10 PM 11/26/2013

## 2013-11-26 NOTE — Patient Instructions (Signed)

## 2013-11-27 ENCOUNTER — Other Ambulatory Visit: Payer: Self-pay | Admitting: Gynecology

## 2013-11-27 ENCOUNTER — Encounter: Payer: Self-pay | Admitting: Gynecology

## 2013-11-27 DIAGNOSIS — Z8639 Personal history of other endocrine, nutritional and metabolic disease: Secondary | ICD-10-CM | POA: Insufficient documentation

## 2013-11-27 DIAGNOSIS — E559 Vitamin D deficiency, unspecified: Secondary | ICD-10-CM

## 2013-11-27 LAB — VITAMIN D 25 HYDROXY (VIT D DEFICIENCY, FRACTURES): VIT D 25 HYDROXY: 15 ng/mL — AB (ref 30–100)

## 2013-11-27 MED ORDER — VITAMIN D (ERGOCALCIFEROL) 1.25 MG (50000 UNIT) PO CAPS: 50000.0000 [IU] | ORAL_CAPSULE | ORAL | Status: DC

## 2013-11-28 ENCOUNTER — Other Ambulatory Visit: Payer: Self-pay | Admitting: Gynecology

## 2013-11-28 DIAGNOSIS — R102 Pelvic and perineal pain: Secondary | ICD-10-CM

## 2013-11-28 DIAGNOSIS — N930 Postcoital and contact bleeding: Secondary | ICD-10-CM

## 2013-11-28 LAB — CYTOLOGY - PAP

## 2013-12-11 ENCOUNTER — Telehealth: Payer: Self-pay | Admitting: *Deleted

## 2013-12-11 NOTE — Telephone Encounter (Signed)
Pt informed with normal pap results 11/26/13.

## 2013-12-16 ENCOUNTER — Emergency Department (HOSPITAL_COMMUNITY): Payer: BC Managed Care – PPO

## 2013-12-16 ENCOUNTER — Emergency Department (HOSPITAL_COMMUNITY)
Admission: EM | Admit: 2013-12-16 | Discharge: 2013-12-16 | Disposition: A | Discharge status: Home / Self Care | Payer: BC Managed Care – PPO | Attending: Emergency Medicine | Admitting: Emergency Medicine

## 2013-12-16 ENCOUNTER — Encounter (HOSPITAL_COMMUNITY): Payer: Self-pay | Admitting: Emergency Medicine

## 2013-12-16 DIAGNOSIS — R112 Nausea with vomiting, unspecified: Secondary | ICD-10-CM | POA: Diagnosis not present

## 2013-12-16 DIAGNOSIS — Z8669 Personal history of other diseases of the nervous system and sense organs: Secondary | ICD-10-CM | POA: Diagnosis not present

## 2013-12-16 DIAGNOSIS — R1031 Right lower quadrant pain: Secondary | ICD-10-CM | POA: Diagnosis not present

## 2013-12-16 DIAGNOSIS — R1012 Left upper quadrant pain: Secondary | ICD-10-CM | POA: Diagnosis not present

## 2013-12-16 DIAGNOSIS — J45909 Unspecified asthma, uncomplicated: Secondary | ICD-10-CM | POA: Diagnosis not present

## 2013-12-16 DIAGNOSIS — Z3202 Encounter for pregnancy test, result negative: Secondary | ICD-10-CM | POA: Insufficient documentation

## 2013-12-16 DIAGNOSIS — R531 Weakness: Secondary | ICD-10-CM | POA: Diagnosis not present

## 2013-12-16 DIAGNOSIS — Z8739 Personal history of other diseases of the musculoskeletal system and connective tissue: Secondary | ICD-10-CM | POA: Insufficient documentation

## 2013-12-16 DIAGNOSIS — R1013 Epigastric pain: Secondary | ICD-10-CM | POA: Insufficient documentation

## 2013-12-16 DIAGNOSIS — Z79899 Other long term (current) drug therapy: Secondary | ICD-10-CM | POA: Insufficient documentation

## 2013-12-16 DIAGNOSIS — R1011 Right upper quadrant pain: Secondary | ICD-10-CM | POA: Diagnosis not present

## 2013-12-16 DIAGNOSIS — Z872 Personal history of diseases of the skin and subcutaneous tissue: Secondary | ICD-10-CM | POA: Diagnosis not present

## 2013-12-16 DIAGNOSIS — E559 Vitamin D deficiency, unspecified: Secondary | ICD-10-CM | POA: Diagnosis not present

## 2013-12-16 DIAGNOSIS — R109 Unspecified abdominal pain: Secondary | ICD-10-CM

## 2013-12-16 DIAGNOSIS — R630 Anorexia: Secondary | ICD-10-CM | POA: Diagnosis not present

## 2013-12-16 DIAGNOSIS — R509 Fever, unspecified: Secondary | ICD-10-CM | POA: Diagnosis not present

## 2013-12-16 DIAGNOSIS — Z8719 Personal history of other diseases of the digestive system: Secondary | ICD-10-CM | POA: Insufficient documentation

## 2013-12-16 DIAGNOSIS — R197 Diarrhea, unspecified: Secondary | ICD-10-CM | POA: Diagnosis not present

## 2013-12-16 LAB — URINALYSIS, ROUTINE W REFLEX MICROSCOPIC
Bilirubin Urine: NEGATIVE
Glucose, UA: NEGATIVE mg/dL
Ketones, ur: NEGATIVE mg/dL
LEUKOCYTES UA: NEGATIVE
NITRITE: NEGATIVE
PROTEIN: NEGATIVE mg/dL
Specific Gravity, Urine: 1.025 (ref 1.005–1.030)
UROBILINOGEN UA: 1 mg/dL (ref 0.0–1.0)
pH: 5.5 (ref 5.0–8.0)

## 2013-12-16 LAB — COMPREHENSIVE METABOLIC PANEL
ALBUMIN: 3.8 g/dL (ref 3.5–5.2)
ALK PHOS: 72 U/L (ref 39–117)
ALT: 12 U/L (ref 0–35)
ANION GAP: 12 (ref 5–15)
AST: 13 U/L (ref 0–37)
BUN: 9 mg/dL (ref 6–23)
CO2: 23 mEq/L (ref 19–32)
Calcium: 9.4 mg/dL (ref 8.4–10.5)
Chloride: 104 mEq/L (ref 96–112)
Creatinine, Ser: 0.73 mg/dL (ref 0.50–1.10)
GFR calc Af Amer: >90 mL/min (ref >90)
GFR calc non Af Amer: >90 mL/min (ref >90)
Glucose, Bld: 84 mg/dL (ref 70–99)
POTASSIUM: 3.6 meq/L — AB (ref 3.7–5.3)
Sodium: 139 mEq/L (ref 137–147)
TOTAL PROTEIN: 7.1 g/dL (ref 6.0–8.3)
Total Bilirubin: 0.6 mg/dL (ref 0.3–1.2)

## 2013-12-16 LAB — CBC WITH DIFFERENTIAL/PLATELET
Basophils Absolute: 0 10*3/uL (ref 0.0–0.1)
Basophils Relative: 0 % (ref 0–1)
Eosinophils Absolute: 0.2 10*3/uL (ref 0.0–0.7)
Eosinophils Relative: 4 % (ref 0–5)
HCT: 40.1 % (ref 36.0–46.0)
Hemoglobin: 13.3 g/dL (ref 12.0–15.0)
Lymphocytes Relative: 29 % (ref 12–46)
Lymphs Abs: 1.1 10*3/uL (ref 0.7–4.0)
MCH: 26.7 pg (ref 26.0–34.0)
MCHC: 33.2 g/dL (ref 30.0–36.0)
MCV: 80.5 fL (ref 78.0–100.0)
Monocytes Absolute: 0.3 10*3/uL (ref 0.1–1.0)
Monocytes Relative: 7 % (ref 3–12)
Neutro Abs: 2.3 10*3/uL (ref 1.7–7.7)
Neutrophils Relative %: 60 % (ref 43–77)
Platelets: 194 10*3/uL (ref 150–400)
RBC: 4.98 MIL/uL (ref 3.87–5.11)
RDW: 13.1 % (ref 11.5–15.5)
WBC: 3.8 10*3/uL — ABNORMAL LOW (ref 4.0–10.5)

## 2013-12-16 LAB — URINE MICROSCOPIC-ADD ON

## 2013-12-16 LAB — LIPASE, BLOOD: Lipase: 22 U/L (ref 11–59)

## 2013-12-16 LAB — PREGNANCY, URINE: Preg Test, Ur: NEGATIVE

## 2013-12-16 MED ORDER — DIPHENHYDRAMINE HCL 50 MG/ML IJ SOLN
25.0000 mg | Freq: Once | INTRAMUSCULAR | Status: AC
  Administered 2013-12-16: 25 mg via INTRAVENOUS
  Filled 2013-12-16: qty 1

## 2013-12-16 MED ORDER — ESOMEPRAZOLE MAGNESIUM 40 MG PO CPDR: 40.0000 mg | DELAYED_RELEASE_CAPSULE | Freq: Every day | ORAL | Status: DC

## 2013-12-16 MED ORDER — DIPHENOXYLATE-ATROPINE 2.5-0.025 MG PO TABS: 1.0000 | ORAL_TABLET | Freq: Four times a day (QID) | ORAL | Status: DC | PRN

## 2013-12-16 MED ORDER — HYDROCODONE-ACETAMINOPHEN 5-325 MG PO TABS: 1.0000 | ORAL_TABLET | Freq: Four times a day (QID) | ORAL | Status: DC | PRN

## 2013-12-16 MED ORDER — ONDANSETRON HCL 4 MG/2ML IJ SOLN
4.0000 mg | Freq: Once | INTRAMUSCULAR | Status: AC
  Administered 2013-12-16: 4 mg via INTRAVENOUS
  Filled 2013-12-16: qty 2

## 2013-12-16 MED ORDER — IOHEXOL 300 MG/ML  SOLN
25.0000 mL | INTRAMUSCULAR | Status: DC | PRN
  Administered 2013-12-16: 25 mL via ORAL
  Filled 2013-12-16: qty 30

## 2013-12-16 MED ORDER — FENTANYL CITRATE 0.05 MG/ML IJ SOLN
100.0000 ug | Freq: Once | INTRAMUSCULAR | Status: AC
  Administered 2013-12-16: 100 ug via INTRAVENOUS
  Filled 2013-12-16: qty 2

## 2013-12-16 MED ORDER — ONDANSETRON HCL 4 MG/2ML IJ SOLN: 4.0000 mg | Freq: Once | INTRAMUSCULAR | Status: DC

## 2013-12-16 MED ORDER — SUCRALFATE 1 G PO TABS: 1.0000 g | ORAL_TABLET | Freq: Three times a day (TID) | ORAL | Status: DC

## 2013-12-16 MED ORDER — IOHEXOL 300 MG/ML  SOLN
100.0000 mL | Freq: Once | INTRAMUSCULAR | Status: AC | PRN
  Administered 2013-12-16: 100 mL via INTRAVENOUS

## 2013-12-16 MED ORDER — SODIUM CHLORIDE 0.9 % IV BOLUS (SEPSIS)
2000.0000 mL | Freq: Once | INTRAVENOUS | Status: AC
Start: 2013-12-16 — End: 2013-12-16
  Administered 2013-12-16: 2000 mL via INTRAVENOUS

## 2013-12-16 NOTE — ED Provider Notes (Signed)
CSN: [id number]     Arrival date & time 12/16/13  1038 History   First MD Initiated Contact with Patient 12/16/13 1121     Chief Complaint  Patient presents with  . Abdominal Pain  . Diarrhea  . Weakness    HPI: Deloria LairOlivia Carignan is a 38 year old female with PMH of IBS and GERD who presents to the ED on 12/16/2013 for abdominal pain that has been present for 3-4 months. She reports initially being seen by her PCP one month ago and being started on Prilosec at that time for GERD, but says her abdominal pain did not decrease in intensity. She says the pain was initially located in her epigastric area but over the past month she reports having episodic shooting pain in her RUQ and RLQ. Over the past week, her pain has also been present in the suprapubic area. She says starting last Sunday she started to have intense diarrhea that was accompanied by vomiting and fever. She says the fever and vomiting lasted until Wednesday and she was out of work that [employer]. She says her fever reached 100.9, and treated it with Tylenol. She has still been having diarrhea with 4-5 episodes per day and says it occurs in less than an hour after she consumes food. She tried taking Imodium in the earlier part of the week, but says that did not help her diarrhea. Since Sunday, she has been only consuming soup and drinking Ginger Ale. She says her epigastric pain gets worse with the food but is unchanged when drinking Ginger Ale.  Over the past 2 days, she has began to feel weak and lightheaded and that is what prompted her to come to the ED. She is scheduled to see a GI specialist on Friday.  Past Medical History  Diagnosis Date  . IBS (irritable bowel syndrome)   . Reflux   . Migraine   . Asthma   . 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 Cervical disc disorder     C5-6  . H/O vitamin D deficiency    Past Surgical History  Procedure Laterality Date  . Cesarean section  2005     twins  . Endometrial ablation  06/2010    her option   Family History  Problem Relation Age of Onset  . Hypertension Father   . Arthritis Mother   . Multiple sclerosis Maternal Aunt   . Osteoarthritis Maternal Grandmother   . Heart attack Maternal Grandfather   . Stroke Paternal Grandmother   . Stroke Paternal Grandfather   . Lupus Cousin    History  Substance Use Topics  . Smoking status: Never Smoker   . Smokeless tobacco: Not on file  . Alcohol Use: 0.0 oz/week    0 Not specified per week     Comment: Drinks wine on occasion   OB History    Gravida Para Term Preterm AB TAB SAB Ectopic Multiple Living   1 1       1 2      Review of Systems  Constitutional: Positive for fever and appetite change. Negative for chills, diaphoresis, activity change and fatigue.  HENT: Negative for ear pain, mouth sores, nosebleeds, rhinorrhea, sinus pressure, sneezing and tinnitus.   Eyes: Negative for photophobia and visual disturbance.  Respiratory: Negative for cough, choking, chest tightness, shortness of breath and wheezing.   Cardiovascular: Negative for chest pain, palpitations and leg swelling.  Gastrointestinal: Positive for nausea, vomiting, abdominal  pain and diarrhea. Negative for constipation, blood in stool, abdominal distention, anal bleeding and rectal pain.  Endocrine: Negative for polydipsia, polyphagia and polyuria.  Genitourinary: Negative for dysuria, urgency, frequency, hematuria, flank pain and difficulty urinating.  Musculoskeletal: Negative for myalgias, back pain, arthralgias, neck pain and neck stiffness.  Skin: Negative for pallor and rash.  Neurological: Negative for dizziness, seizures, syncope, weakness, numbness and headaches.  Hematological: Negative for adenopathy.      Allergies  Aspirin; Fruit & vegetable daily; and Other  Home Medications   Prior to Admission medications   Medication Sig Start Date End Date Taking? Authorizing Provider   ALPRAZolam Prudy Feeler(XANAX) 0.25 MG tablet Take 0.25 mg by mouth daily as needed for anxiety.  11/27/13  Yes Historical Provider, MD  citalopram (CELEXA) 20 MG tablet Take 20 mg by mouth daily. 11/28/13  Yes Historical Provider, MD  Vitamin D, Ergocalciferol, (DRISDOL) 50000 UNITS CAPS capsule Take 1 capsule (50,000 Units total) by mouth every 7 (seven) days. 11/27/13  Yes Ok EdwardsJuan H Fernandez, MD   BP 106/70 mmHg  Pulse 59  Temp(Src) 98.3 F (36.8 C) (Oral)  Resp 17  SpO2 99%  LMP 11/16/2013 Physical Exam  Constitutional: She is oriented to person, place, and time. She appears well-developed and well-nourished. No distress.  HENT:  Head: Normocephalic and atraumatic.  Mouth/Throat: Oropharynx is clear and moist. No oropharyngeal exudate.  Eyes: Conjunctivae are normal. Pupils are equal, round, and reactive to light. Right eye exhibits no discharge. Left eye exhibits no discharge.  Neck: Normal range of motion. Neck supple. No thyromegaly present.  Cardiovascular: Normal rate, regular rhythm, normal heart sounds and intact distal pulses.  Exam reveals no gallop and no friction rub.   No murmur heard. Pulmonary/Chest: Effort normal and breath sounds normal. No stridor. No respiratory distress. She has no wheezes. She has no rales. She exhibits no tenderness.  Abdominal: Soft. Bowel sounds are normal. She exhibits no distension and no mass. There is no hepatosplenomegaly. There is tenderness in the right upper quadrant, epigastric area, suprapubic area and left upper quadrant. There is no rebound, no guarding, no CVA tenderness, no tenderness at McBurney's point and negative Murphy's sign.  Musculoskeletal: Normal range of motion. She exhibits no edema or tenderness.  Lymphadenopathy:    She has no cervical adenopathy.  Neurological: She is alert and oriented to person, place, and time. No cranial nerve deficit. Coordination normal.  Skin: Skin is warm and dry. No rash noted. She is not diaphoretic. No  erythema. No pallor.  Nursing note and vitals reviewed.   ED Course  Procedures (including critical care time) Labs Review Labs Reviewed  COMPREHENSIVE METABOLIC PANEL - Abnormal; Notable for the following:    Potassium 3.6 (*)    All other components within normal limits  URINALYSIS, ROUTINE W REFLEX MICROSCOPIC - Abnormal; Notable for the following:    Color, Urine AMBER (*)    Hgb urine dipstick MODERATE (*)    All other components within normal limits  CBC WITH DIFFERENTIAL - Abnormal; Notable for the following:    WBC 3.8 (*)    All other components within normal limits  URINE MICROSCOPIC-ADD ON - Abnormal; Notable for the following:    Squamous Epithelial / LPF MANY (*)    All other components within normal limits  LIPASE, BLOOD  PREGNANCY, URINE    Imaging Review Ct Abdomen Pelvis W Contrast  12/16/2013   CLINICAL DATA:  Patient with right lower quadrant and mid upper abdominal pain.  Nausea, vomiting and diarrhea.  EXAM: CT ABDOMEN AND PELVIS WITH CONTRAST  TECHNIQUE: Multidetector CT imaging of the abdomen and pelvis was performed using the standard protocol following bolus administration of intravenous contrast.  CONTRAST:  OMNIPAQUE IOHEXOL 300 MG/ML  SOLN  COMPARISON:  CT 12/26/2007  FINDINGS: Visualization of the lower thorax demonstrates no consolidative or nodular pulmonary opacities. Normal heart size. Small hiatal hernia.  Liver is normal in size and contour without focal hepatic lesion identified. The gallbladder is unremarkable. No intrahepatic or extrahepatic biliary ductal dilatation. The spleen, pancreas and bilateral adrenal glands are unremarkable. The kidneys enhance symmetrically with contrast. No hydronephrosis. The urinary bladder is unremarkable.  Normal caliber abdominal aorta. No retroperitoneal lymphadenopathy. The uterus and bilateral adnexal structures are unremarkable.  The descending and sigmoid colon is decompressed, limiting evaluation. There is  suggestion of thickening of the sigmoid colon and rectum. No evidence for bowel obstruction. No free fluid or free intraperitoneal air. The appendix is normal.  No aggressive or acute appearing osseous lesions.  IMPRESSION: The sigmoid colon and rectum are decompressed, limiting evaluation however there is suggestion of wall thickening as can be seen with colitis in the appropriate clinical setting.  Normal appendix.   Electronically Signed   By: Annia Belt M.D.   On: 12/16/2013 15:18     MDM   Final diagnoses:  Abdominal pain  Abdominal pain    Follow-up with the GI doctor.  Patient is been stable here in the emergency department.  She is advised to return here as needed.  Smokes likely gastritis versus peptic ulcer.  Patient is explained that her testing here today was normal and she felt she needed to be admitted. i explained to the patient that their is nothing on her testing that indicates explaining great detail that today she is had no issues noted that would require admission.  The patient seemed upset about this and advised that she will still need to follow up with GI doctor for further evaluation and care.  She states that what he we just do that now and advised not of her testing indicated admission  Carlyle Dolly, PA-C 12/18/13 1635  Kristen N Ward, DO 12/19/13 1610

## 2013-12-16 NOTE — ED Notes (Signed)
Called lab able to add CBC with Differential to available blood.

## 2013-12-16 NOTE — ED Notes (Signed)
Nurse speaking to patient and family member regarding discharge instructions. Asked to speak with Provider for additional questions.

## 2013-12-16 NOTE — ED Notes (Signed)
Pt. Stated, I've had abdominal pain for 3-4 months and las week I started having some diarrhea and keep having it.  Now I feel weak.  I went to see dr. Nehemiah SettlePolite back in October and he put me on Prilosec . It didn't work.  im suppose to see a GI doctor o Friday but i can't wait that long.

## 2013-12-16 NOTE — ED Notes (Signed)
PA at bedside.

## 2013-12-16 NOTE — Discharge Instructions (Signed)
Return here as needed.  Follow-up with your GI doctor as scheduled.  Slowly increase your fluid intake

## 2013-12-16 NOTE — Progress Notes (Signed)
Contrast started for CT.

## 2013-12-19 ENCOUNTER — Ambulatory Visit: Payer: BC Managed Care – PPO | Admitting: Gynecology

## 2013-12-19 ENCOUNTER — Other Ambulatory Visit: Payer: BC Managed Care – PPO

## 2014-01-10 ENCOUNTER — Ambulatory Visit (INDEPENDENT_AMBULATORY_CARE_PROVIDER_SITE_OTHER): Payer: BC Managed Care – PPO

## 2014-01-10 ENCOUNTER — Encounter: Payer: Self-pay | Admitting: Gynecology

## 2014-01-10 ENCOUNTER — Ambulatory Visit (INDEPENDENT_AMBULATORY_CARE_PROVIDER_SITE_OTHER): Payer: BC Managed Care – PPO | Admitting: Gynecology

## 2014-01-10 DIAGNOSIS — R102 Pelvic and perineal pain: Secondary | ICD-10-CM

## 2014-01-10 DIAGNOSIS — N83202 Unspecified ovarian cyst, left side: Secondary | ICD-10-CM | POA: Insufficient documentation

## 2014-01-10 DIAGNOSIS — N8320 Unspecified ovarian cysts: Secondary | ICD-10-CM

## 2014-01-10 DIAGNOSIS — N930 Postcoital and contact bleeding: Secondary | ICD-10-CM

## 2014-01-10 MED ORDER — MEDROXYPROGESTERONE ACETATE 150 MG/ML IM SUSP
150.0000 mg | Freq: Once | INTRAMUSCULAR | Status: AC
  Administered 2014-01-10: 150 mg via INTRAMUSCULAR

## 2014-01-10 NOTE — Patient Instructions (Signed)
CA-125 Tumor Marker CA 125 is a tumor marker that is used to help monitor the course of ovarian or endometrial cancer. PREPARATION FOR TEST No preparation is necessary. NORMAL FINDINGS Adults: 0-35 units/mL (0-35 kilounits)/L Ranges for normal findings may vary among different laboratories and hospitals. You should always check with your doctor after having lab work or other tests done to discuss the meaning of your test results and whether your values are considered within normal limits. MEANING OF TEST  Your caregiver will go over the test results with you and discuss the importance and meaning of your results, as well as treatment options and the need for additional tests if necessary. OBTAINING THE TEST RESULTS It is your responsibility to obtain your test results. Ask the lab or department performing the test when and how you will get your results. Document Released: 01/20/2004 Document Revised: 03/22/2011 Document Reviewed: 12/06/2007 Exit[employer] Patient Information 2015 [employer]. This information is not intended to replace advice given to you by your health care provider. Make sure you discuss any questions you have with your health care provider. Ovarian Cyst An ovarian cyst is a fluid-filled sac that forms on an ovary. The ovaries are small organs that produce eggs in women. Various types of cysts can form on the ovaries. Most are not cancerous. Many do not cause problems, and they often go away on their own. Some may cause symptoms and require treatment. Common types of ovarian cysts include:  Functional cysts--These cysts may occur every month during the menstrual cycle. This is normal. The cysts usually go away with the next menstrual cycle if the woman does not get pregnant. Usually, there are no symptoms with a functional cyst.  Endometrioma cysts--These cysts form from the tissue that lines the uterus. They are also called "chocolate cysts" because they become filled with blood  that turns brown. This type of cyst can cause pain in the lower abdomen during intercourse and with your menstrual period.  Cystadenoma cysts--This type develops from the cells on the outside of the ovary. These cysts can get very big and cause lower abdomen pain and pain with intercourse. This type of cyst can twist on itself, cut off its blood supply, and cause severe pain. It can also easily rupture and cause a lot of pain.  Dermoid cysts--This type of cyst is sometimes found in both ovaries. These cysts may contain different kinds of body tissue, such as skin, teeth, hair, or cartilage. They usually do not cause symptoms unless they get very big.  Theca lutein cysts--These cysts occur when too much of a certain hormone (human chorionic gonadotropin) is produced and overstimulates the ovaries to produce an egg. This is most common after procedures used to assist with the conception of a baby (in vitro fertilization). CAUSES   Fertility drugs can cause a condition in which multiple large cysts are formed on the ovaries. This is called ovarian hyperstimulation syndrome.  A condition called polycystic ovary syndrome can cause hormonal imbalances that can lead to nonfunctional ovarian cysts. SIGNS AND SYMPTOMS  Many ovarian cysts do not cause symptoms. If symptoms are present, they may include:  Pelvic pain or pressure.  Pain in the lower abdomen.  Pain during sexual intercourse.  Increasing girth (swelling) of the abdomen.  Abnormal menstrual periods.  Increasing pain with menstrual periods.  Stopping having menstrual periods without being pregnant. DIAGNOSIS  These cysts are commonly found during a routine or annual pelvic exam. Tests may be ordered to find   out more about the cyst. These tests may include:  Ultrasound.  X-ray of the pelvis.  CT scan.  MRI.  Blood tests. TREATMENT  Many ovarian cysts go away on their own without treatment. Your health care provider may want  to check your cyst regularly for 2-3 months to see if it changes. For women in menopause, it is particularly important to monitor a cyst closely because of the higher rate of ovarian cancer in menopausal women. When treatment is needed, it may include any of the following:  A procedure to drain the cyst (aspiration). This may be done using a long needle and ultrasound. It can also be done through a laparoscopic procedure. This involves using a thin, lighted tube with a tiny camera on the end (laparoscope) inserted through a small incision.  Surgery to remove the whole cyst. This may be done using laparoscopic surgery or an open surgery involving a larger incision in the lower abdomen.  Hormone treatment or birth control pills. These methods are sometimes used to help dissolve a cyst. HOME CARE INSTRUCTIONS   Only take over-the-counter or prescription medicines as directed by your health care provider.  Follow up with your health care provider as directed.  Get regular pelvic exams and Pap tests. SEEK MEDICAL CARE IF:   Your periods are late, irregular, or painful, or they stop.  Your pelvic pain or abdominal pain does not go away.  Your abdomen becomes larger or swollen.  You have pressure on your bladder or trouble emptying your bladder completely.  You have pain during sexual intercourse.  You have feelings of fullness, pressure, or discomfort in your stomach.  You lose weight for no apparent reason.  You feel generally ill.  You become constipated.  You lose your appetite.  You develop acne.  You have an increase in body and facial hair.  You are gaining weight, without changing your exercise and eating habits.  You think you are pregnant. SEEK IMMEDIATE MEDICAL CARE IF:   You have increasing abdominal pain.  You feel sick to your stomach (nauseous), and you throw up (vomit).  You develop a fever that comes on suddenly.  You have abdominal pain during a bowel  movement.  Your menstrual periods become heavier than usual. MAKE SURE YOU:  Understand these instructions.  Will watch your condition.  Will get help right away if you are not doing well or get worse. Document Released: 12/28/2004 Document Revised: 01/02/2013 Document Reviewed: 09/04/2012 Exit[employer] Patient Information 2015 [employer]. This information is not intended to replace advice given to you by your health care provider. Make sure you discuss any questions you have with your health care provider.  

## 2014-01-10 NOTE — Progress Notes (Signed)
   Patient presented to the office today to discuss her ultrasound. Patient had been seen in the office on November 16 for annual gynecological examination and been complaining of on and off right lower quadrant pain for the previous 4 months. She stated that these symptoms were sharp in nature and lasted 3-4 minutes. Sometimes she will feel bloated suprapubically. She describes occasional dyspareunia and very infrequent she has had postcoital bleeding. She was also seen in the emergency department earlier this month with epigastric discomfort and is currently being followed by her gastroenterologist who did a colonoscopy and endoscopy in December 15 and she states that she was diagnosed with gastroesophageal reflux and esophagitis and irritable bowel syndrome. Patient had a normal Pap smear November this year. Patient was asymptomatic today.  Ultrasound: Uterus measuring 9.2 x 5.3 x 4.7 cm with endometrial stripe of 8.5 mm. (Last menstrual period 12/15/2013). Right ovary normal. A left ovarian echo-free thick wall vascular cyst with low-level echoes measuring 3.6 x 3.1 x 3.1 cm was noted no fluid in the cul-de-sac. Highly suspicious for hemorrhagic cyst.  Assessment/plan: Right ovarian cyst probably hemorrhagic in nature. A CA-125 will be drawn today it's limitations were discussed with the patient. Patient received Depo-Provera 150 mg IM for ovarian cyst suppression. She will return back in 3 months for follow-up ultrasound.

## 2014-01-11 LAB — CA 125: CA 125: 13 U/mL (ref <35)

## 2014-02-15 ENCOUNTER — Other Ambulatory Visit: Payer: Self-pay | Admitting: Gynecology

## 2014-03-04 ENCOUNTER — Telehealth: Payer: Self-pay | Admitting: *Deleted

## 2014-03-04 NOTE — Telephone Encounter (Signed)
Pt received depo provera injection for ovarian cyst 01/10/14, states no cycle in January had some light spotting which stopped. Pt said this month spotting has been everyday, wearing thin panty liner. Pt aware depo provera may delay cycle, but her concern is that spotting everyday. Pt asked if this was normal to spot daily? Pt would like recommendations. Please advise

## 2014-03-04 NOTE — Telephone Encounter (Signed)
Please call her and a prescription of a low dose estrogen for 21 days consisting of Estrace 0.5 mg daily.

## 2014-03-05 MED ORDER — ESTRADIOL 0.5 MG PO TABS: 0.5000 mg | ORAL_TABLET | Freq: Every day | ORAL | Status: DC

## 2014-03-05 NOTE — Telephone Encounter (Signed)
Pt informed with the below note, rx sent. 

## 2014-04-03 ENCOUNTER — Other Ambulatory Visit: Payer: BC Managed Care – PPO

## 2014-04-03 ENCOUNTER — Ambulatory Visit: Payer: BC Managed Care – PPO | Admitting: Gynecology

## 2014-05-15 ENCOUNTER — Encounter: Payer: Self-pay | Admitting: Gynecology

## 2014-05-15 ENCOUNTER — Ambulatory Visit (INDEPENDENT_AMBULATORY_CARE_PROVIDER_SITE_OTHER): Payer: BC Managed Care – PPO

## 2014-05-15 ENCOUNTER — Ambulatory Visit (INDEPENDENT_AMBULATORY_CARE_PROVIDER_SITE_OTHER): Payer: BC Managed Care – PPO | Admitting: Gynecology

## 2014-05-15 ENCOUNTER — Other Ambulatory Visit: Payer: Self-pay | Admitting: Gynecology

## 2014-05-15 VITALS — BP 110/80 | Ht 63.0 in | Wt 150.0 lb

## 2014-05-15 DIAGNOSIS — Z8742 Personal history of other diseases of the female genital tract: Secondary | ICD-10-CM

## 2014-05-15 DIAGNOSIS — R102 Pelvic and perineal pain: Secondary | ICD-10-CM | POA: Diagnosis not present

## 2014-05-15 DIAGNOSIS — N8320 Unspecified ovarian cysts: Secondary | ICD-10-CM

## 2014-05-15 DIAGNOSIS — N83202 Unspecified ovarian cyst, left side: Secondary | ICD-10-CM

## 2014-05-15 NOTE — Progress Notes (Signed)
   Patient presented to the office today to discuss her ultrasound that was requested as a result for follow-up on her left ovarian cyst after having received Depo-Provera 150 mg IM on 01/10/2014. She had a normal CA 125 on that day. Patient asymptomatic. She is not had a menstrual cycle since the Depo-Provera. Her husband has had a vasectomy.   Ultrasound today: Uterus measured 8.1 x 4.6 x 3.7 cm with endometrial stripe of 3.4 mm. Right and left ovary were normal. Previous left ovarian cyst no longer present. No fluid in the cul-de-sac.  Assessment/plan: #1 complete resolution of left ovarian cyst patient otherwise asymptomatic we'll return later this year for annual exam. #2 patient with history vitamin D deficiency had been started on vitamin D 50,000 units every weekly for 12 weeks. She had left the office before I had asked her if she had completed the recommended treatment and that I need her to return for vitamin D level blood test. #3 amenorrhea treated to Depo-Provera injection patient reassured husband with vasectomy.

## 2014-05-16 ENCOUNTER — Telehealth: Payer: Self-pay | Admitting: *Deleted

## 2014-05-16 DIAGNOSIS — E559 Vitamin D deficiency, unspecified: Secondary | ICD-10-CM

## 2014-05-16 NOTE — Telephone Encounter (Signed)
Pt informed with the below will call back to schedule lab appointment

## 2014-05-16 NOTE — Telephone Encounter (Signed)
-----   Message from Ok EdwardsJuan H Fernandez, MD sent at 05/15/2014  4:28 PM EDT ----- Victorino DikeJennifer, please call patient and inform her that she had left and I had noticed that she was contacted several months ago because of vitamin D deficiency and was started on 50,000 units every weekly for 3 months. She was to have her vitamin D level rechecked and start vitamin D3 2000 units daily. Please make sure that she is taking her vitamin D3 2000 units daily and that she come to the office either this week or next week to have her vitamin D level checked to make sure it has returned back to normal. Please give her my apology for her inconvenience.

## 2015-03-25 ENCOUNTER — Ambulatory Visit (INDEPENDENT_AMBULATORY_CARE_PROVIDER_SITE_OTHER): Payer: BC Managed Care – PPO | Admitting: Women's Health

## 2015-03-25 ENCOUNTER — Encounter: Payer: Self-pay | Admitting: Women's Health

## 2015-03-25 VITALS — BP 124/78

## 2015-03-25 DIAGNOSIS — N898 Other specified noninflammatory disorders of vagina: Secondary | ICD-10-CM | POA: Diagnosis not present

## 2015-03-25 DIAGNOSIS — R300 Dysuria: Secondary | ICD-10-CM | POA: Diagnosis not present

## 2015-03-25 DIAGNOSIS — B373 Candidiasis of vulva and vagina: Secondary | ICD-10-CM | POA: Diagnosis not present

## 2015-03-25 DIAGNOSIS — B3731 Acute candidiasis of vulva and vagina: Secondary | ICD-10-CM

## 2015-03-25 LAB — WET PREP FOR TRICH, YEAST, CLUE
CLUE CELLS WET PREP: NONE SEEN
TRICH WET PREP: NONE SEEN

## 2015-03-25 LAB — URINALYSIS W MICROSCOPIC + REFLEX CULTURE
Bilirubin Urine: NEGATIVE
Casts: NONE SEEN [LPF]
Crystals: NONE SEEN [HPF]
Glucose, UA: NEGATIVE
Hgb urine dipstick: NEGATIVE
Ketones, ur: NEGATIVE
Leukocytes, UA: NEGATIVE
Nitrite: NEGATIVE
Protein, ur: NEGATIVE
RBC / HPF: NONE SEEN RBC/HPF (ref <=2)
Specific Gravity, Urine: <1.005 (ref 1.001–1.035)
Yeast: NONE SEEN [HPF]
pH: 6.5 (ref 5.0–8.0)

## 2015-03-25 MED ORDER — FLUCONAZOLE 150 MG PO TABS: 150.0000 mg | ORAL_TABLET | Freq: Once | ORAL | Status: DC

## 2015-03-25 MED ORDER — PHENAZOPYRIDINE HCL 100 MG PO TABS: 100.0000 mg | ORAL_TABLET | Freq: Three times a day (TID) | ORAL | Status: DC | PRN

## 2015-03-25 NOTE — Patient Instructions (Addendum)
Urinary Frequency The number of times a normal person urinates depends upon how much liquid they take in and how much liquid they are losing. If the temperature is hot and there is high humidity, then the person will sweat more and usually breathe a little more frequently. These factors decrease the amount of frequency of urination that would be considered normal. The amount you drink is easily determined, but the amount of fluid lost is sometimes more difficult to calculate.  Fluid is lost in two ways:  Sensible fluid loss is usually measured by the amount of urine that you get rid of. Losses of fluid can also occur with diarrhea.  Insensible fluid loss is more difficult to measure. It is caused by evaporation. Insensible loss of fluid occurs through breathing and sweating. It usually ranges from a little less than a quart to a little more than a quart of fluid a day. In normal temperatures and activity levels, the average person may urinate 4 to 7 times in a 24-hour period. Needing to urinate more often than that could indicate a problem. If one urinates 4 to 7 times in 24 hours and has large volumes each time, that could indicate a different problem from one who urinates 4 to 7 times a day and has small volumes. The time of urinating is also important. Most urinating should be done during the waking hours. Getting up at night to urinate frequently can indicate some problems. CAUSES  The bladder is the organ in your lower abdomen that holds urine. Like a balloon, it swells some as it fills up. Your nerves sense this and tell you it is time to head for the bathroom. There are a number of reasons that you might feel the need to urinate more often than usual. They include:  Urinary tract infection. This is usually associated with other signs such as burning when you urinate.  In men, problems with the prostate (a walnut-size gland that is located near the tube that carries urine out of your body). There  are two reasons why the prostate can cause an increased frequency of urination:  An enlarged prostate that does not let the bladder empty well. If the bladder only half empties when you urinate, then it only has half the capacity to fill before you have to urinate again.  The nerves in the bladder become more hypersensitive with an increased size of the prostate even if the bladder empties completely.  Pregnancy.  Obesity. Excess weight is more likely to cause a problem for women than for men.  Bladder stones or other bladder problems.  Caffeine.  Alcohol.  Medications. For example, drugs that help the body get rid of extra fluid (diuretics) increase urine production. Some other medicines must be taken with lots of fluids.  Muscle or nerve weakness. This might be the result of a spinal cord injury, a stroke, multiple sclerosis, or Parkinson disease.  Long-standing diabetes can decrease the sensation of the bladder. This loss of sensation makes it harder to sense the bladder needs to be emptied. Over a period of years, the bladder is stretched out by constant overfilling. This weakens the bladder muscles so that the bladder does not empty well and has less capacity to fill with new urine.  Interstitial cystitis (also called painful bladder syndrome). This condition develops because the tissues that line the inside of the bladder are inflamed (inflammation is the body's way of reacting to injury or infection). It causes pain and frequent   urination. It occurs in women more often than in men. °DIAGNOSIS  °· To decide what might be causing your urinary frequency, your health care provider will probably: °¨ Ask about symptoms you have noticed. °¨ Ask about your overall health. This will include questions about any medications you are taking. °¨ Do a physical examination. °· Order some tests. These might include: °¨ A blood test to check for diabetes or other health issues that could be contributing  to the problem. °¨ Urine testing. This could measure the flow of urine and the pressure on the bladder. °¨ A test of your neurological system (the brain, spinal cord, and nerves). This is the system that senses the need to urinate. °¨ A bladder test to check whether it is emptying completely when you urinate. °¨ Cystoscopy. This test uses a thin tube with a tiny camera on it. It offers a look inside your urethra and bladder to see if there are problems. °¨ Imaging tests. You might be given a contrast dye and then asked to urinate. X-rays are taken to see how your bladder is working. °TREATMENT  °It is important for you to be evaluated to determine if the amount or frequency that you have is unusual or abnormal. If it is found to be abnormal, the cause should be determined and this can usually be found out easily. Depending upon the cause, treatment could include medication, stimulation of the nerves, or surgery. °There are not too many things that you can do as an individual to change your urinary frequency. It is important that you balance the amount of fluid intake needed to compensate for your activity and the temperature. Medical problems will be diagnosed and taken care of by your physician. There is no particular bladder training such as Kegel exercises that you can do to help urinary frequency. This is an exercise that is usually recommended for people who have leaking of urine when they laugh, cough, or sneeze. °HOME CARE INSTRUCTIONS  °· Take any medications your health care provider prescribed or suggested. Follow the directions carefully. °· Practice any lifestyle changes that are recommended. These might include: °¨ Drinking less fluid or drinking at different times of the day. If you need to urinate often during the night, for example, you may need to stop drinking fluids early in the evening. °¨ Cutting down on caffeine or alcohol. They both can make you need to urinate more often than normal. Caffeine  is found in coffee, tea, and sodas. °¨ Losing weight, if that is recommended. °· Keep a journal or a log. You might be asked to record how much you drink and when and where you feel the need to urinate. This will also help evaluate how well the treatment provided by your physician is working. °SEEK MEDICAL CARE IF:  °· Your need to urinate often gets worse. °· You feel increased pain or irritation when you urinate. °· You notice blood in your urine. °· You have questions about any medications that your health care provider recommended. °· You notice blood, pus, or swelling at the site of any test or treatment procedure. °· You develop a fever of more than 100.5°F (38.1°C). °SEEK IMMEDIATE MEDICAL CARE IF:  °You develop a fever of more than 102.0°F (38.9°C). °  °This information is not intended to replace advice given to you by your health care provider. Make sure you discuss any questions you have with your health care provider. °  °Document Released: 10/24/2008 Document Revised:   01/18/2014 Document Reviewed: 10/24/2008 Elsevier Interactive Patient Education 2016 [employer] Monilial Vaginitis Vaginitis in a soreness, swelling and redness (inflammation) of the vagina and vulva. Monilial vaginitis is not a sexually transmitted infection. CAUSES  Yeast vaginitis is caused by yeast (candida) that is normally found in your vagina. With a yeast infection, the candida has overgrown in number to a point that upsets the chemical balance. SYMPTOMS   White, thick vaginal discharge.  Swelling, itching, redness and irritation of the vagina and possibly the lips of the vagina (vulva).  Burning or painful urination.  Painful intercourse. DIAGNOSIS  Things that may contribute to monilial vaginitis are:  Postmenopausal and virginal states.  Pregnancy.  Infections.  Being tired, sick or stressed, especially if you had monilial vaginitis in the past.  Diabetes. Good control will help lower the  chance.  Birth control pills.  Tight fitting garments.  Using bubble bath, feminine sprays, douches or deodorant tampons.  Taking certain medications that kill germs (antibiotics).  Sporadic recurrence can occur if you become ill. TREATMENT  Your caregiver will give you medication.  There are several kinds of anti monilial vaginal creams and suppositories specific for monilial vaginitis. For recurrent yeast infections, use a suppository or cream in the vagina 2 times a week, or as directed.  Anti-monilial or steroid cream for the itching or irritation of the vulva may also be used. Get your caregiver's permission.  Painting the vagina with methylene blue solution may help if the monilial cream does not work.  Eating yogurt may help prevent monilial vaginitis. HOME CARE INSTRUCTIONS   Finish all medication as prescribed.  Do not have sex until treatment is completed or after your caregiver tells you it is okay.  Take warm sitz baths.  Do not douche.  Do not use tampons, especially scented ones.  Wear cotton underwear.  Avoid tight pants and panty hose.  Tell your sexual partner that you have a yeast infection. They should go to their caregiver if they have symptoms such as mild rash or itching.  Your sexual partner should be treated as well if your infection is difficult to eliminate.  Practice safer sex. Use condoms.  Some vaginal medications cause latex condoms to fail. Vaginal medications that harm condoms are:  Cleocin cream.  Butoconazole (Femstat).  Terconazole (Terazol) vaginal suppository.  Miconazole (Monistat) (may be purchased over the counter). SEEK MEDICAL CARE IF:   You have a temperature by mouth above 102 F (38.9 C).  The infection is getting worse after 2 days of treatment.  The infection is not getting better after 3 days of treatment.  You develop blisters in or around your vagina.  You develop vaginal bleeding, and it is not your  menstrual period.  You have pain when you urinate.  You develop intestinal problems.  You have pain with sexual intercourse.   This information is not intended to replace advice given to you by your health care provider. Make sure you discuss any questions you have with your health care provider.   Document Released: 10/07/2004 Document Revised: 03/22/2011 Document Reviewed: 07/01/2014 Elsevier Interactive Patient Education [employer]2016 Else[employer] Inc.

## 2015-03-25 NOTE — Progress Notes (Signed)
Presents with 2-3 weeks of suprapubic pressure pain, frequency without nocturia, dysuria with perceived retention at times, and mild vaginal discharge. Denies hematuria, abnormal vaginal bleeding, itching or lesions. Married/vasectomy/ monthly cycle. Flu 2-3 weeks ago, treated with Tamiflu, feels like symptoms started around that time.  Exam: Appears well. No external genitalia erythema. Speculum exam: Small amount of white discharge, no odor noted. Cervix normal. Wet prep positive for yeast Urinalysis: Negative, 0-5 WBCs, few bacteria  Yeast vaginitis Suprapubic pressure with frequency  Plan: Rx for Diflucan 150 mg with instructions for use given, 1 refill. Rx for Pyridium 100 mg 3 times daily 5 days given, patient aware to not take longer than 5 days. Recommended good water intake and hydration. Urine culture pending. Instructed to call if symptoms persist.

## 2015-03-27 ENCOUNTER — Encounter: Payer: Self-pay | Admitting: Women's Health

## 2015-03-27 LAB — URINE CULTURE

## 2015-03-31 ENCOUNTER — Telehealth: Payer: Self-pay

## 2015-03-31 NOTE — Telephone Encounter (Signed)
In last week and treated with a Diflucan tablet. She feels like sx are still present and wants to take the refill but wanted to be sure okay to take it this soon. I told her it was fine to take it now if she still felt like she had yeast inf sx.

## 2015-04-21 ENCOUNTER — Telehealth: Payer: Self-pay | Admitting: *Deleted

## 2015-04-21 MED ORDER — TERCONAZOLE 0.4 % VA CREA: 1.0000 | TOPICAL_CREAM | Freq: Every day | VAGINAL | Status: DC

## 2015-04-21 NOTE — Telephone Encounter (Signed)
Pt called to follow up from OV on 03/25/15 took diflucan 150 #1,pt said the infection has returned seem worse, itching and lots of irritation, swollen and red vaginally. Pt asked if another Rx could be sent? Please advise

## 2015-04-21 NOTE — Telephone Encounter (Signed)
Okay, Terazol 7 one applicator at bedtime 7. Have her call if no relief.

## 2015-04-21 NOTE — Telephone Encounter (Signed)
Pt aware, Rx sent. 

## 2015-04-28 ENCOUNTER — Telehealth: Payer: Self-pay | Admitting: *Deleted

## 2015-04-28 MED ORDER — FLUCONAZOLE 150 MG PO TABS: 150.0000 mg | ORAL_TABLET | Freq: Once | ORAL | Status: DC

## 2015-04-28 NOTE — Telephone Encounter (Signed)
Pt aware Rx sent.  

## 2015-04-28 NOTE — Telephone Encounter (Signed)
Ok for diflucan 150 mg , 

## 2015-04-28 NOTE — Telephone Encounter (Signed)
(  pt aware you are out of the office) Pt was treated Terazol 7 day cream on 04/21/15, still has slight itching, feel a lot better, but no 100%. Pt asked if Rx for Diflucan could be sent? Please advise

## 2015-05-02 ENCOUNTER — Ambulatory Visit (INDEPENDENT_AMBULATORY_CARE_PROVIDER_SITE_OTHER): Payer: BC Managed Care – PPO | Admitting: Women's Health

## 2015-05-02 ENCOUNTER — Encounter: Payer: Self-pay | Admitting: Women's Health

## 2015-05-02 VITALS — BP 110/78 | Ht 63.0 in | Wt 150.0 lb

## 2015-05-02 DIAGNOSIS — B373 Candidiasis of vulva and vagina: Secondary | ICD-10-CM

## 2015-05-02 DIAGNOSIS — R350 Frequency of micturition: Secondary | ICD-10-CM

## 2015-05-02 DIAGNOSIS — B3731 Acute candidiasis of vulva and vagina: Secondary | ICD-10-CM

## 2015-05-02 DIAGNOSIS — N898 Other specified noninflammatory disorders of vagina: Secondary | ICD-10-CM

## 2015-05-02 LAB — WET PREP FOR TRICH, YEAST, CLUE
Clue Cells Wet Prep HPF POC: NONE SEEN
Trich, Wet Prep: NONE SEEN

## 2015-05-02 MED ORDER — FLUCONAZOLE 100 MG PO TABS: ORAL_TABLET | ORAL | Status: DC

## 2015-05-02 NOTE — Progress Notes (Signed)
Patient ID: Lori Mercado, female   DOB: [birth year], 40 y.o.   MRN: [id number] Presents with complaint of persistent vaginal irritation with mild itching. Denies odor, mild urinary frequency without pain or burning. Has taken Diflucan  and used Terazol vaginal cream with some relief. Monthly cycle, no bleeding between cycles. Same partner/vasectomy.  Exam: Appears well. External genitalia mild erythema at introitus, speculum exam scant white discharge, no odor noted, wet prep positive for few yeast, negative clues, moderate bacteria.Bimanual no CMT or adnexal tenderness.  Persistent yeast vaginitis  Plan: Diflucan 100 mg by mouth daily for 7 days, prescription, proper use given and reviewed instructed to call if no relief of symptoms. Yeast prevention discussed.

## 2015-05-02 NOTE — Patient Instructions (Signed)

## 2015-05-23 ENCOUNTER — Encounter: Payer: Self-pay | Admitting: Gynecology

## 2015-05-23 ENCOUNTER — Ambulatory Visit (INDEPENDENT_AMBULATORY_CARE_PROVIDER_SITE_OTHER): Payer: BC Managed Care – PPO | Admitting: Gynecology

## 2015-05-23 VITALS — BP 120/86 | Ht 63.0 in | Wt 150.0 lb

## 2015-05-23 DIAGNOSIS — Z8639 Personal history of other endocrine, nutritional and metabolic disease: Secondary | ICD-10-CM

## 2015-05-23 DIAGNOSIS — Z01419 Encounter for gynecological examination (general) (routine) without abnormal findings: Secondary | ICD-10-CM

## 2015-05-23 DIAGNOSIS — N898 Other specified noninflammatory disorders of vagina: Secondary | ICD-10-CM | POA: Diagnosis not present

## 2015-05-23 DIAGNOSIS — F419 Anxiety disorder, unspecified: Secondary | ICD-10-CM | POA: Diagnosis not present

## 2015-05-23 LAB — WET PREP FOR TRICH, YEAST, CLUE
CLUE CELLS WET PREP: NONE SEEN
Trich, Wet Prep: NONE SEEN
YEAST WET PREP: NONE SEEN

## 2015-05-23 MED ORDER — ALPRAZOLAM 0.25 MG PO TABS: 0.2500 mg | ORAL_TABLET | Freq: Every day | ORAL | Status: DC | PRN

## 2015-05-23 MED ORDER — MEGESTROL ACETATE 40 MG PO TABS: 40.0000 mg | ORAL_TABLET | Freq: Two times a day (BID) | ORAL | Status: DC

## 2015-05-23 NOTE — Progress Notes (Signed)
Lori Mercado January 21, 1975 [id number]   History:    40 y.o.  for annual gyn exam with no complaints today. Patient was treated recently for a yeast vaginitis and patient wanted to be tested to make sure that she did not have any yeast infection. She is also planning on a trip coming up soon right at the start of her menses and wanted to know if we Prescribed her medication to perform on the start of her menstrual cycle. Patient with no past history of any abnormal Pap smear. She is reporting normal menstrual cycles. Patient with past history vitamin D deficiency.  Past medical history,surgical history, family history and social history were all reviewed and documented in the EPIC chart.  Gynecologic History Patient's last menstrual period was 05/12/2015. Contraception: vasectomy Last Pap: 2015. Results were: normal Last mammogram: 2014. Results were: normal  Obstetric History OB History  Gravida Para Term Preterm AB SAB TAB Ectopic Multiple Living  # Outcome Date GA Lbr Len/2nd Weight Sex Delivery Anes PTL Lv  1 Para                ROS: A ROS was performed and pertinent positives and negatives are included in the history.  GENERAL: No fevers or chills. HEENT: No change in vision, no earache, sore throat or sinus congestion. NECK: No pain or stiffness. CARDIOVASCULAR: No chest pain or pressure. No palpitations. PULMONARY: No shortness of breath, cough or wheeze. GASTROINTESTINAL: No abdominal pain, nausea, vomiting or diarrhea, melena or bright red blood per rectum. GENITOURINARY: No urinary frequency, urgency, hesitancy or dysuria. MUSCULOSKELETAL: No joint or muscle pain, no back pain, no recent trauma. DERMATOLOGIC: No rash, no itching, no lesions. ENDOCRINE: No polyuria, polydipsia, no heat or cold intolerance. No recent change in weight. HEMATOLOGICAL: No anemia or easy bruising or bleeding. NEUROLOGIC: No headache, seizures, numbness, tingling or weakness.  PSYCHIATRIC: No depression, no loss of interest in normal activity or change in sleep pattern.     Exam: chaperone present  BP 120/86 mmHg  Ht  (1.6 m)  Wt 150 lb (68.04 kg)  BMI 26.58 kg/m2  LMP 05/12/2015  Body mass index is 26.58 kg/(m^2).  General appearance : Well developed well nourished female. No acute distress HEENT: Eyes: no retinal hemorrhage or exudates,  Neck supple, trachea midline, no carotid bruits, no thyroidmegaly Lungs: Clear to auscultation, no rhonchi or wheezes, or rib retractions  Heart: Regular rate and rhythm, no murmurs or gallops Breast:Examined in sitting and supine position were symmetrical in appearance, no palpable masses or tenderness,  no skin retraction, no nipple inversion, no nipple discharge, no skin discoloration, no axillary or supraclavicular lymphadenopathy Abdomen: no palpable masses or tenderness, no rebound or guarding Extremities: no edema or skin discoloration or tenderness  Pelvic:  Bartholin, Urethra, Skene Glands: Within normal limits             Vagina: No gross lesions or discharge  Cervix: No gross lesions or discharge  Uterus  anteverted, normal size, shape and consistency, non-tender and mobile  Adnexa  Without masses or tenderness  Anus and perineum  normal   Rectovaginal  normal sphincter tone without palpated masses or tenderness             Hemoccult not indicated   Wet prep: Few bacteria and WBC  Assessment/Plan:  40 y.o. female for annual exam will return back to the office next  week in a fasting state for the following blood work: Comprehensive metabolic panel, fasting lipid profile, TSH, CBC, and urinalysis. Because of her past history vitamin D deficiency will check a vitamin D level as well. Patient did not need a Pap smear this year according to the new guidelines. Patient was reminded do her monthly breast examination. Patient will be prescribed Megace 40 mg to take 1 by mouth twice a day for 10 days starting the  day before commencement of her cycles to prolong it during her vacation trip.   Ok EdwardsFERNANDEZ,Shevon Sian H MD, 1:13 PM 05/23/2015

## 2015-05-24 LAB — URINALYSIS W MICROSCOPIC + REFLEX CULTURE
Bacteria, UA: NONE SEEN [HPF]
Bilirubin Urine: NEGATIVE
CASTS: NONE SEEN [LPF]
Crystals: NONE SEEN [HPF]
GLUCOSE, UA: NEGATIVE
HGB URINE DIPSTICK: NEGATIVE
Ketones, ur: NEGATIVE
NITRITE: NEGATIVE
Protein, ur: NEGATIVE
RBC / HPF: NONE SEEN RBC/HPF (ref <=2)
SPECIFIC GRAVITY, URINE: 1.016 (ref 1.001–1.035)
YEAST: NONE SEEN [HPF]
pH: 6 (ref 5.0–8.0)

## 2015-05-27 LAB — URINE CULTURE

## 2015-06-04 ENCOUNTER — Other Ambulatory Visit: Payer: Self-pay | Admitting: Gynecology

## 2015-06-04 MED ORDER — NITROFURANTOIN MONOHYD MACRO 100 MG PO CAPS: 100.0000 mg | ORAL_CAPSULE | Freq: Two times a day (BID) | ORAL | Status: DC

## 2015-06-13 ENCOUNTER — Other Ambulatory Visit: Payer: BC Managed Care – PPO

## 2015-06-13 LAB — CBC WITH DIFFERENTIAL/PLATELET
BASOS ABS: 0 {cells}/uL (ref 0–200)
Basophils Relative: 0 %
EOS ABS: 210 {cells}/uL (ref 15–500)
Eosinophils Relative: 5 %
HEMATOCRIT: 38.2 % (ref 35.0–45.0)
HEMOGLOBIN: 12.5 g/dL (ref 11.7–15.5)
LYMPHS PCT: 31 %
Lymphs Abs: 1302 cells/uL (ref 850–3900)
MCH: 27.3 pg (ref 27.0–33.0)
MCHC: 32.7 g/dL (ref 32.0–36.0)
MCV: 83.4 fL (ref 80.0–100.0)
MONO ABS: 294 {cells}/uL (ref 200–950)
MPV: 10.6 fL (ref 7.5–12.5)
Monocytes Relative: 7 %
NEUTROS PCT: 57 %
Neutro Abs: 2394 cells/uL (ref 1500–7800)
Platelets: 216 10*3/uL (ref 140–400)
RBC: 4.58 MIL/uL (ref 3.80–5.10)
RDW: 14.4 % (ref 11.0–15.0)
WBC: 4.2 10*3/uL (ref 3.8–10.8)

## 2015-06-13 LAB — COMPREHENSIVE METABOLIC PANEL
ALBUMIN: 3.9 g/dL (ref 3.6–5.1)
ALT: 15 U/L (ref 6–29)
AST: 17 U/L (ref 10–30)
Alkaline Phosphatase: 58 U/L (ref 33–115)
BUN: 11 mg/dL (ref 7–25)
CALCIUM: 8.8 mg/dL (ref 8.6–10.2)
CHLORIDE: 107 mmol/L (ref 98–110)
CO2: 24 mmol/L (ref 20–31)
Creat: 0.82 mg/dL (ref 0.50–1.10)
GLUCOSE: 85 mg/dL (ref 65–99)
POTASSIUM: 3.8 mmol/L (ref 3.5–5.3)
Sodium: 140 mmol/L (ref 135–146)
Total Bilirubin: 0.7 mg/dL (ref 0.2–1.2)
Total Protein: 6.2 g/dL (ref 6.1–8.1)

## 2015-06-13 LAB — LIPID PANEL
CHOL/HDL RATIO: 3.1 ratio (ref <=5.0)
Cholesterol: 150 mg/dL (ref 125–200)
HDL: 49 mg/dL (ref >=46)
LDL CALC: 91 mg/dL (ref <130)
TRIGLYCERIDES: 50 mg/dL (ref <150)
VLDL: 10 mg/dL (ref <30)

## 2015-06-13 LAB — VITAMIN D 25 HYDROXY (VIT D DEFICIENCY, FRACTURES): Vit D, 25-Hydroxy: 22 ng/mL — ABNORMAL LOW (ref 30–100)

## 2015-06-13 LAB — TSH: TSH: 1.91 m[IU]/L

## 2015-06-16 ENCOUNTER — Other Ambulatory Visit: Payer: Self-pay | Admitting: Gynecology

## 2015-06-16 DIAGNOSIS — E559 Vitamin D deficiency, unspecified: Secondary | ICD-10-CM

## 2015-06-16 MED ORDER — VITAMIN D (ERGOCALCIFEROL) 1.25 MG (50000 UNIT) PO CAPS: 50000.0000 [IU] | ORAL_CAPSULE | ORAL | Status: DC

## 2015-06-25 ENCOUNTER — Emergency Department (HOSPITAL_COMMUNITY)
Admission: EM | Admit: 2015-06-25 | Discharge: 2015-06-26 | Disposition: A | Discharge status: Home / Self Care | Payer: BC Managed Care – PPO | Attending: Emergency Medicine | Admitting: Emergency Medicine

## 2015-06-25 ENCOUNTER — Emergency Department (HOSPITAL_COMMUNITY): Payer: BC Managed Care – PPO

## 2015-06-25 ENCOUNTER — Encounter (HOSPITAL_COMMUNITY): Payer: Self-pay | Admitting: Emergency Medicine

## 2015-06-25 DIAGNOSIS — H539 Unspecified visual disturbance: Secondary | ICD-10-CM

## 2015-06-25 DIAGNOSIS — H538 Other visual disturbances: Secondary | ICD-10-CM | POA: Diagnosis not present

## 2015-06-25 DIAGNOSIS — G43909 Migraine, unspecified, not intractable, without status migrainosus: Secondary | ICD-10-CM | POA: Insufficient documentation

## 2015-06-25 DIAGNOSIS — R791 Abnormal coagulation profile: Secondary | ICD-10-CM | POA: Diagnosis not present

## 2015-06-25 DIAGNOSIS — J45909 Unspecified asthma, uncomplicated: Secondary | ICD-10-CM | POA: Insufficient documentation

## 2015-06-25 DIAGNOSIS — G43809 Other migraine, not intractable, without status migrainosus: Secondary | ICD-10-CM

## 2015-06-25 NOTE — ED Provider Notes (Signed)
CSN: [id number]     Arrival date & time 06/25/15  2303 History  By signing my name below, I, Bethel Born, attest that this documentation has been prepared under the direction and in the presence of Gilda Crease, MD. Electronically Signed: Bethel Born, ED Scribe. 06/26/2015. 3:24 AM   Chief Complaint  Patient presents with  . Blurred Vision  . Headache    The history is provided by the patient. No language interpreter was used.   Lori Mercado is a 40 y.o. female with PMHx of migraines and cervical disc disorder who presents to the Emergency Department complaining of new bilateral blurred vision with sudden onset approximately 4 hours ago. The pt states that she was riding in a car and the street signs suddenly went out of focus. Her vision is equally blurred on both sides though she notes an associated feeling of pressure behind the left eye. Associated symptoms include increased posterior neck pain that radiates to the head. The pt has chronic neck pain but states that this is worse.  Pt denies numbness, tingling, and weakness.   Past Medical History  Diagnosis Date  . IBS (irritable bowel syndrome)   . Reflux   . Migraine   . Asthma   . Connective tissue disorder (HCC)     Poorly defined connective tissue disorder  . Cervical disc disorder     C5-6  . H/O vitamin D deficiency    Past Surgical History  Procedure Laterality Date  . Cesarean section  2005    twins  . Endometrial ablation  06/2010    her option  . Spine surgery     Family History  Problem Relation Age of Onset  . Hypertension Father   . Arthritis Mother   . Multiple sclerosis Maternal Aunt   . Osteoarthritis Maternal Grandmother   . Heart attack Maternal Grandfather   . Stroke Paternal Grandmother   . Stroke Paternal Grandfather   . Lupus Cousin    Social History  Substance Use Topics  . Smoking status: Never Smoker   . Smokeless tobacco: None  . Alcohol Use: 0.0 oz/week    0 Standard  drinks or equivalent per week     Comment: Drinks wine on occasion   OB History    Gravida Para Term Preterm AB TAB SAB Ectopic Multiple Living   1 1       1 2      Review of Systems  Eyes: Positive for visual disturbance.  Musculoskeletal: Positive for neck pain.  Neurological: Positive for headaches. Negative for weakness and numbness.  All other systems reviewed and are negative.     Allergies  Aspirin; Fruit & vegetable daily; and Other  Home Medications   Prior to Admission medications   Medication Sig Start Date End Date Taking? Authorizing Provider  Vitamin D, Ergocalciferol, (DRISDOL) 50000 units CAPS capsule Take 1 capsule (50,000 Units total) by mouth every 7 (seven) days. 06/16/15  Yes [name], MD  ALPRAZolam Prudy Feeler) 0.25 MG tablet Take 1 tablet (0.25 mg total) by mouth daily as needed for anxiety. Reported on 03/25/2015 Patient not taking: Reported on 06/26/2015 05/23/15   [name], MD  fluconazole (DIFLUCAN) 100 MG tablet Take daily for 7 days and then prn Patient not taking: Reported on 05/23/2015 05/02/15   Harrington Challenger, NP  megestrol (MEGACE) 40 MG tablet Take 1 tablet (40 mg total) by mouth 2 (two) times daily. Patient not taking: Reported on 06/26/2015 05/23/15  [name], MD  nitrofurantoin, macrocrystal-monohydrate, (MACROBID) 100 MG capsule Take 1 capsule (100 mg total) by mouth 2 (two) times daily with a meal. Patient not taking: Reported on 06/26/2015 06/04/15   [name], MD   BP 110/68 mmHg  Pulse 60  Temp(Src) 98.1 F (36.7 C) (Oral)  Resp 14  Ht  (1.6 m)  Wt 139 lb 3 oz (63.135 kg)  BMI 24.66 kg/m2  SpO2 99%  LMP 06/15/2015 Physical Exam  Constitutional: She is oriented to person, place, and time. She appears well-developed and well-nourished. No distress.  HENT:  Head: Normocephalic and atraumatic.  Right Ear: Hearing normal.  Left Ear: Hearing normal.  Nose: Nose normal.  Mouth/Throat: Oropharynx is clear and  moist and mucous membranes are normal.  Eyes: Conjunctivae and EOM are normal. Pupils are equal, round, and reactive to light. Right eye exhibits no chemosis and no discharge. Left eye exhibits no chemosis and no discharge. Right eye exhibits normal extraocular motion. Left eye exhibits normal extraocular motion.  Fundoscopic exam:      The right eye shows no arteriolar narrowing, no AV nicking, no exudate, no hemorrhage and no papilledema.       The left eye shows no arteriolar narrowing, no AV nicking, no exudate, no hemorrhage and no papilledema.  Neck: Normal range of motion. Neck supple.  Cardiovascular: Regular rhythm, S1 normal and S2 normal.  Exam reveals no gallop and no friction rub.   No murmur heard. Pulmonary/Chest: Effort normal and breath sounds normal. No respiratory distress. She exhibits no tenderness.  Abdominal: Soft. Normal appearance and bowel sounds are normal. There is no hepatosplenomegaly. There is no tenderness. There is no rebound, no guarding, no tenderness at McBurney's point and negative Murphy's sign. No hernia.  Musculoskeletal: Normal range of motion.  Neurological: She is alert and oriented to person, place, and time. She has normal strength. No cranial nerve deficit or sensory deficit. Coordination normal. GCS eye subscore is 4. GCS verbal subscore is 5. GCS motor subscore is 6.  Extraocular muscle movement: normal No visual field cut Pupils: equal and reactive both direct and consensual response is normal No nystagmus present    Sensory function is intact to light touch, pinprick Proprioception intact  Grip strength 5/5 symmetric in upper extremities No pronator drift Normal finger to nose bilaterally  Lower extremity strength 5/5 against gravity Normal heel to shin bilaterally  Gait: normal   Skin: Skin is warm, dry and intact. No rash noted. No cyanosis.  Psychiatric: She has a normal mood and affect. Her speech is normal and behavior is normal.  Thought content normal.  Nursing note and vitals reviewed.   ED Course  Procedures (including critical care time) DIAGNOSTIC STUDIES: Oxygen Saturation is 99% on RA,  normal by my interpretation.    COORDINATION OF CARE: 12:19 AM Discussed treatment plan which includes lab work, CT head without contrast, and EKG with pt at bedside and pt agreed to plan.  12:41 AM-Consult complete with Dr. Desmond Lope. Patient case explained and discussed. He recommends MRI brain with and without contrast.    3:24 AM I re-evaluated the patient and provided an update on the results of her MRI.   Labs Review Labs Reviewed  COMPREHENSIVE METABOLIC PANEL - Abnormal; Notable for the following:    Potassium 3.2 (*)    Glucose, Bld 130 (*)    Total Protein 6.4 (*)    All other components within normal limits  CBG MONITORING, ED -  Abnormal; Notable for the following:    Glucose-Capillary 137 (*)    All other components within normal limits  I-STAT CHEM 8, ED - Abnormal; Notable for the following:    Potassium 3.3 (*)    Glucose, Bld 130 (*)    Calcium, Ion 1.10 (*)    All other components within normal limits  PROTIME-INR  APTT  CBC  DIFFERENTIAL  I-STAT TROPOININ, ED    Imaging Review Ct Head Wo Contrast  06/26/2015  CLINICAL DATA:  Initial evaluation for headache for several months. One-day history of blurry vision. EXAM: CT HEAD WITHOUT CONTRAST TECHNIQUE: Contiguous axial images were obtained from the base of the skull through the vertex without intravenous contrast. COMPARISON:  Previous brain MRI from 09/11/2012. FINDINGS: There is no acute intracranial hemorrhage or infarct. No mass lesion or midline shift. Gray-white matter differentiation is well maintained. Ventricles are normal in size without evidence of hydrocephalus. CSF containing spaces are within normal limits. No extra-axial fluid collection. The calvarium is intact. Orbital soft tissues are within normal limits. The paranasal sinuses and  mastoid air cells are well pneumatized and free of fluid. Scalp soft tissues are unremarkable. IMPRESSION: Negative head CT.  No acute intracranial process identified. Electronically Signed   By: Rise MuBenjamin  McClintock M.D.   On: 06/26/2015 00:20   Mr Laqueta JeanBrain W EAWo Contrast  06/26/2015  CLINICAL DATA:  Initial evaluation for sudden onset blurry vision. EXAM: MRI HEAD WITHOUT AND WITH CONTRAST TECHNIQUE: Multiplanar, multiecho pulse sequences of the brain and surrounding structures were obtained without and with intravenous contrast. CONTRAST:  13mL MULTIHANCE GADOBENATE DIMEGLUMINE 529 MG/ML IV SOLN COMPARISON:  Prior CT from 06/25/2015 as well as previous MRI from 09/11/2012. FINDINGS: Cerebral volume within normal limits. A single focus of T2/FLAIR hyperintensity present within the deep white matter of the right centrum semi ovale, stable. This is nonspecific, but of doubtful clinical significance. No other focal parenchymal signal abnormality identified. No abnormal foci of restricted diffusion to suggest acute intracranial infarct. Gray-white matter differentiation well maintained. Major intracranial vascular flow voids are preserved. No acute or chronic intracranial hemorrhage. No areas of chronic infarction. No mass lesion, midline shift, or mass effect. No hydrocephalus. No extra-axial fluid collection. Major dural sinuses are grossly patent. No abnormal enhancement. Craniocervical junction within normal limits. Visualized upper cervical spine unremarkable. Incidental note made of a partially empty sella, stable. No acute abnormality about the globes and orbits. Mild mucosal thickening within the ethmoidal air cells and left maxillary sinus. Paranasal sinuses are otherwise clear. No mastoid effusion. Inner ear structures grossly normal. Bone marrow signal intensity within normal limits. No scalp soft tissue abnormality. IMPRESSION: Essentially normal brain MRI. No acute intracranial abnormality identified. No  change since 09/11/2012. Again noted is a solitary T2/FLAIR hyperintensity within right centrum semi ovale, nonspecific, but may be related to chronic microvascular ischemic disease or underlying migrainous disorder. While underlying demyelinating disease is possible, this is felt to be unlikely given the stability of this lesion and lack of additional lesion since previous exam. Electronically Signed   By: Rise MuBenjamin  McClintock M.D.   On: 06/26/2015 03:15   I have personally reviewed and evaluated these images and lab results as part of my medical decision-making.   EKG Interpretation   Date/Time:  Wednesday June 25 2015 23:36:19 EDT Ventricular Rate:  70 PR Interval:  164 QRS Duration: 82 QT Interval:  418 QTC Calculation: 451 R Axis:   81 Text Interpretation:  Normal sinus rhythm with sinus arrhythmia  Normal ECG  Confirmed by Blong Busk  MD, Carolynne Schuchard [phone]) on 06/26/2015 12:14:42 AM      MDM   Final diagnoses:  Visual disturbance  Other type of migraine   Patient presents to the emergency department with complaints of vision disturbances that was sudden onset. She had some pain in the posterior head and neck area as well when the patient's symptoms began. She does have a history of migraines.  Symptoms are binocular. Funduscopic examination is normal. Neurologic examination is otherwise unremarkable. Screening CT did not show acute abnormality. Discussed with Dr. Desmond Lope, on call for neurology. Recommended MRI with and without contrast. Study has been performed and is negative. She does have a solitary hyperintensity in the right centrum semi-ovale, but this was seen in 2014 and is not felt to be of any significance today. Patient will follow up with outpatient neurology. She was told she should see an eye doctor as well. Patient administered migraine medications here in the ER with some improvement  I personally performed the services described in this documentation, which was scribed in  my presence. The recorded information has been reviewed and is accurate.    Gilda Crease, MD 06/26/15 [phone]

## 2015-06-25 NOTE — ED Notes (Signed)
Pt reports sudden onset of blurred vision to bilateral eyes with posterior neck pain that radiates into the back of her head since 8pm.  Denies dizziness.  No facial droop.  Speech clear.  No arm drift or leg drift.  Notified Dr. Blinda LeatherwoodPollina of pt.  Stroke protocol ordered but no code stroke activation per Dr. Blinda LeatherwoodPollina.

## 2015-06-25 NOTE — ED Notes (Signed)
CT notified of pt

## 2015-06-26 ENCOUNTER — Emergency Department (HOSPITAL_COMMUNITY): Payer: BC Managed Care – PPO

## 2015-06-26 LAB — I-STAT CHEM 8, ED
BUN: 12 mg/dL (ref 6–20)
Calcium, Ion: 1.1 mmol/L — ABNORMAL LOW (ref 1.12–1.23)
Chloride: 105 mmol/L (ref 101–111)
Creatinine, Ser: 0.7 mg/dL (ref 0.44–1.00)
Glucose, Bld: 130 mg/dL — ABNORMAL HIGH (ref 65–99)
HCT: 40 % (ref 36.0–46.0)
Hemoglobin: 13.6 g/dL (ref 12.0–15.0)
Potassium: 3.3 mmol/L — ABNORMAL LOW (ref 3.5–5.1)
Sodium: 141 mmol/L (ref 135–145)
TCO2: 22 mmol/L (ref 0–100)

## 2015-06-26 LAB — COMPREHENSIVE METABOLIC PANEL
ALK PHOS: 57 U/L (ref 38–126)
ALT: 14 U/L (ref 14–54)
ANION GAP: 5 (ref 5–15)
AST: 17 U/L (ref 15–41)
Albumin: 3.7 g/dL (ref 3.5–5.0)
BILIRUBIN TOTAL: 0.7 mg/dL (ref 0.3–1.2)
BUN: 11 mg/dL (ref 6–20)
CALCIUM: 9 mg/dL (ref 8.9–10.3)
CO2: 24 mmol/L (ref 22–32)
Chloride: 108 mmol/L (ref 101–111)
Creatinine, Ser: 0.82 mg/dL (ref 0.44–1.00)
GFR calc non Af Amer: >60 mL/min (ref >60)
Glucose, Bld: 130 mg/dL — ABNORMAL HIGH (ref 65–99)
Potassium: 3.2 mmol/L — ABNORMAL LOW (ref 3.5–5.1)
Sodium: 137 mmol/L (ref 135–145)
TOTAL PROTEIN: 6.4 g/dL — AB (ref 6.5–8.1)

## 2015-06-26 LAB — DIFFERENTIAL
Basophils Absolute: 0 10*3/uL (ref 0.0–0.1)
Basophils Relative: 0 %
Eosinophils Absolute: 0.2 10*3/uL (ref 0.0–0.7)
Eosinophils Relative: 3 %
Lymphocytes Relative: 32 %
Lymphs Abs: 1.7 10*3/uL (ref 0.7–4.0)
Monocytes Absolute: 0.5 10*3/uL (ref 0.1–1.0)
Monocytes Relative: 9 %
Neutro Abs: 2.8 10*3/uL (ref 1.7–7.7)
Neutrophils Relative %: 56 %

## 2015-06-26 LAB — CBC
HCT: 39.5 % (ref 36.0–46.0)
Hemoglobin: 12.5 g/dL (ref 12.0–15.0)
MCH: 26.6 pg (ref 26.0–34.0)
MCHC: 31.6 g/dL (ref 30.0–36.0)
MCV: 84 fL (ref 78.0–100.0)
Platelets: 190 10*3/uL (ref 150–400)
RBC: 4.7 MIL/uL (ref 3.87–5.11)
RDW: 13.9 % (ref 11.5–15.5)
WBC: 5.2 10*3/uL (ref 4.0–10.5)

## 2015-06-26 LAB — APTT: aPTT: 27 s (ref 24–37)

## 2015-06-26 LAB — I-STAT TROPONIN, ED: Troponin i, poc: 0 ng/mL (ref 0.00–0.08)

## 2015-06-26 LAB — PROTIME-INR
INR: 1.1 (ref 0.00–1.49)
PROTHROMBIN TIME: 14.4 s (ref 11.6–15.2)

## 2015-06-26 LAB — CBG MONITORING, ED: Glucose-Capillary: 137 mg/dL — ABNORMAL HIGH (ref 65–99)

## 2015-06-26 MED ORDER — ACETAMINOPHEN 500 MG PO TABS
1000.0000 mg | ORAL_TABLET | Freq: Once | ORAL | Status: AC
  Administered 2015-06-26: 1000 mg via ORAL
  Filled 2015-06-26: qty 2

## 2015-06-26 MED ORDER — PROCHLORPERAZINE EDISYLATE 5 MG/ML IJ SOLN
10.0000 mg | Freq: Once | INTRAMUSCULAR | Status: AC
  Administered 2015-06-26: 10 mg via INTRAMUSCULAR
  Filled 2015-06-26: qty 2

## 2015-06-26 MED ORDER — DIPHENHYDRAMINE HCL 25 MG PO CAPS
25.0000 mg | ORAL_CAPSULE | Freq: Once | ORAL | Status: AC
  Administered 2015-06-26: 25 mg via ORAL
  Filled 2015-06-26: qty 1

## 2015-06-26 MED ORDER — GADOBENATE DIMEGLUMINE 529 MG/ML IV SOLN
15.0000 mL | Freq: Once | INTRAVENOUS | Status: AC | PRN
  Administered 2015-06-26: 13 mL via INTRAVENOUS

## 2015-06-26 NOTE — Discharge Instructions (Signed)
Migraine Headache A migraine headache is an intense, throbbing pain on one or both sides of your head. A migraine can last for 30 minutes to several hours. CAUSES  The exact cause of a migraine headache is not always known. However, a migraine may be caused when nerves in the brain become irritated and release chemicals that cause inflammation. This causes pain. Certain things may also trigger migraines, such as:  Alcohol.  Smoking.  Stress.  Menstruation.  Aged cheeses.  Foods or drinks that contain nitrates, glutamate, aspartame, or tyramine.  Lack of sleep.  Chocolate.  Caffeine.  Hunger.  Physical exertion.  Fatigue.  Medicines used to treat chest pain (nitroglycerine), birth control pills, estrogen, and some blood pressure medicines. SIGNS AND SYMPTOMS  Pain on one or both sides of your head.  Pulsating or throbbing pain.  Severe pain that prevents daily activities.  Pain that is aggravated by any physical activity.  Nausea, vomiting, or both.  Dizziness.  Pain with exposure to bright lights, loud noises, or activity.  General sensitivity to bright lights, loud noises, or smells. Before you get a migraine, you may get warning signs that a migraine is coming (aura). An aura may include:  Seeing flashing lights.  Seeing bright spots, halos, or zigzag lines.  Having tunnel vision or blurred vision.  Having feelings of numbness or tingling.  Having trouble talking.  Having muscle weakness. DIAGNOSIS  A migraine headache is often diagnosed based on:  Symptoms.  Physical exam.  A CT scan or MRI of your head. These imaging tests cannot diagnose migraines, but they can help rule out other causes of headaches. TREATMENT Medicines may be given for pain and nausea. Medicines can also be given to help prevent recurrent migraines.  HOME CARE INSTRUCTIONS  Only take over-the-counter or prescription medicines for pain or discomfort as directed by your  health care provider. The use of long-term narcotics is not recommended.  Lie down in a dark, quiet room when you have a migraine.  Keep a journal to find out what may trigger your migraine headaches. For example, write down:  What you eat and drink.  How much sleep you get.  Any change to your diet or medicines.  Limit alcohol consumption.  Quit smoking if you smoke.  Get 7-9 hours of sleep, or as recommended by your health care provider.  Limit stress.  Keep lights dim if bright lights bother you and make your migraines worse. SEEK IMMEDIATE MEDICAL CARE IF:   Your migraine becomes severe.  You have a fever.  You have a stiff neck.  You have vision loss.  You have muscular weakness or loss of muscle control.  You start losing your balance or have trouble walking.  You feel faint or pass out.  You have severe symptoms that are different from your first symptoms. MAKE SURE YOU:   Understand these instructions.  Will watch your condition.  Will get help right away if you are not doing well or get worse.   This information is not intended to replace advice given to you by your health care provider. Make sure you discuss any questions you have with your health care provider.   Document Released: 12/28/2004 Document Revised: 01/18/2014 Document Reviewed: 09/04/2012 Elsevier Interactive Patient Education 2016 [employer].  Visual Disturbances You have had a disturbance in your vision. This may be caused by various conditions, such as:  Migraines. Migraine headaches are often preceded by a disturbance in vision. Blind spots or  light flashes are followed by a headache. This type of visual disturbance is temporary. It does not damage the eye.  Glaucoma. This is caused by increased pressure in the eye. Symptoms include haziness, blurred vision, or seeing rainbow colored circles when looking at bright lights. Partial or complete visual loss can occur. You may or may  not experience eye pain. Visual loss may be gradual or sudden and is irreversible. Glaucoma is the leading cause of blindness.  Retina problems. Vision will be reduced if the retina becomes detached or if there is a circulation problem as with diabetes, high blood pressure, or a mini-stroke. Symptoms include seeing "floaters," flashes of light, or shadows, as if a curtain has fallen over your eye.  Optic nerve problems. The main nerve in your eye can be damaged by redness, soreness, and swelling (inflammation), poor circulation, drugs, and toxins. It is very important to have a complete exam done by a specialist to determine the exact cause of your eye problem. The specialist may recommend medicines or surgery, depending on the cause of the problem. This can help prevent further loss of vision or reduce the risk of having a stroke. Contact the caregiver to whom you have been referred and arrange for follow-up care right away. SEEK IMMEDIATE MEDICAL CARE IF:   Your vision gets worse.  You develop severe headaches.  You have any weakness or numbness in the face, arms, or legs.  You have any trouble speaking or walking.   This information is not intended to replace advice given to you by your health care provider. Make sure you discuss any questions you have with your health care provider.   Document Released: 02/05/2004 Document Revised: 03/22/2011 Document Reviewed: 06/06/2013 Elsevier Interactive Patient Education [employer]

## 2015-06-26 NOTE — ED Notes (Signed)
Pt. currently at CT scan .  

## 2015-06-26 NOTE — ED Notes (Signed)
Patient transported to MRI 

## 2015-09-11 ENCOUNTER — Other Ambulatory Visit: Payer: Self-pay | Admitting: Gynecology

## 2015-10-01 ENCOUNTER — Telehealth: Payer: Self-pay | Admitting: *Deleted

## 2015-10-01 NOTE — Telephone Encounter (Signed)
Pt called today stating she found a armpit lump yesterday after doing breast exam, asked what to do. I advised to schedule office visit with provider.

## 2015-10-02 ENCOUNTER — Ambulatory Visit (INDEPENDENT_AMBULATORY_CARE_PROVIDER_SITE_OTHER): Payer: BC Managed Care – PPO | Admitting: Gynecology

## 2015-10-02 ENCOUNTER — Encounter: Payer: Self-pay | Admitting: Gynecology

## 2015-10-02 VITALS — BP 112/70 | Wt 137.8 lb

## 2015-10-02 DIAGNOSIS — Z23 Encounter for immunization: Secondary | ICD-10-CM | POA: Diagnosis not present

## 2015-10-02 DIAGNOSIS — L739 Follicular disorder, unspecified: Secondary | ICD-10-CM | POA: Diagnosis not present

## 2015-10-02 NOTE — Progress Notes (Signed)
   Patient is a 40 year old who presented to the office today stating that a few days ago when she was taking a bath she thought she felt a nodular area underneath her right axilla. Patient denies any fever, chills, nausea, vomiting. She's had no drainage from any visible lesion. She denies any recent trauma. She denies any galactorrhea. She reports normal menstrual cycles her husband had a vasectomy. Patient also interested in flu vaccine today.  Exam: Both breasts were examined sitting supine position both breasts are symmetrical in appearance left breast no palpable masses tenderness no supraclavicular axillary lymphadenopathy nontender Right breast no palpable masses or tenderness no supraclavicular or axillary lymphadenopathy. The area the patient was concern appears to be a small pimple-like subcutaneous area consistent with a mild follicular or epidermal inclusion cyst.  Assessment/plan: Right axillary epidermal inclusion cysts/follicular cyst reassurance given. Patient will be having her baseline mammogram next year. Patient received the flu vaccine and literature information was provided.

## 2015-10-02 NOTE — Patient Instructions (Signed)
Influenza Virus Vaccine (Flucelvax) What is this medicine? INFLUENZA VIRUS VACCINE (in floo EN zuh VAHY ruhs vak SEEN) helps to reduce the risk of getting influenza also known as the flu. The vaccine only helps protect you against some strains of the flu. This medicine may be used for other purposes; ask your health care provider or pharmacist if you have questions. What should I tell my health care provider before I take this medicine? They need to know if you have any of these conditions: -bleeding disorder like hemophilia -fever or infection -Guillain-Barre syndrome or other neurological problems -immune system problems -infection with the human immunodeficiency virus (HIV) or AIDS -low blood platelet counts -multiple sclerosis -an unusual or allergic reaction to influenza virus vaccine, other medicines, foods, dyes or preservatives -pregnant or trying to get pregnant -breast-feeding How should I use this medicine? This vaccine is for injection into a muscle. It is given by a health care professional. A copy of Vaccine Information Statements will be given before each vaccination. Read this sheet carefully each time. The sheet may change frequently. Talk to your pediatrician regarding the use of this medicine in children. Special care may be needed. Overdosage: If you think you've taken too much of this medicine contact a poison control center or emergency room at once. Overdosage: If you think you have taken too much of this medicine contact a poison control center or emergency room at once. NOTE: This medicine is only for you. Do not share this medicine with others. What if I miss a dose? This does not apply. What may interact with this medicine? -chemotherapy or radiation therapy -medicines that lower your immune system like etanercept, anakinra, infliximab, and adalimumab -medicines that treat or prevent blood clots like warfarin -phenytoin -steroid medicines like prednisone or  cortisone -theophylline -vaccines This list may not describe all possible interactions. Give your health care provider a list of all the medicines, herbs, non-prescription drugs, or dietary supplements you use. Also tell them if you smoke, drink alcohol, or use illegal drugs. Some items may interact with your medicine. What should I watch for while using this medicine? Report any side effects that do not go away within 3 days to your doctor or health care professional. Call your health care provider if any unusual symptoms occur within 6 weeks of receiving this vaccine. You may still catch the flu, but the illness is not usually as bad. You cannot get the flu from the vaccine. The vaccine will not protect against colds or other illnesses that may cause fever. The vaccine is needed every year. What side effects may I notice from receiving this medicine? Side effects that you should report to your doctor or health care professional as soon as possible: -allergic reactions like skin rash, itching or hives, swelling of the face, lips, or tongue Side effects that usually do not require medical attention (Report these to your doctor or health care professional if they continue or are bothersome.): -fever -headache -muscle aches and pains -pain, tenderness, redness, or swelling at the injection site -tiredness This list may not describe all possible side effects. Call your doctor for medical advice about side effects. You may report side effects to FDA at 1-800-FDA-1088. Where should I keep my medicine? The vaccine will be given by a health care professional in a clinic, pharmacy, doctor's office, or other health care setting. You will not be given vaccine doses to store at home. NOTE: This sheet is a summary. It may not cover   all possible information. If you have questions about this medicine, talk to your doctor, pharmacist, or health care provider.    2016, Elsevier/Gold Standard. (2010-12-09  14:06:47) Folliculitis Folliculitis is redness, soreness, and swelling (inflammation) of the hair follicles. This condition can occur anywhere on the body. People with weakened immune systems, diabetes, or obesity have a greater risk of getting folliculitis. CAUSES  Bacterial infection. This is the most common cause.  Fungal infection.  Viral infection.  Contact with certain chemicals, especially oils and tars. Long-term folliculitis can result from bacteria that live in the nostrils. The bacteria may trigger multiple outbreaks of folliculitis over time. SYMPTOMS Folliculitis most commonly occurs on the scalp, thighs, legs, back, buttocks, and areas where hair is shaved frequently. An early sign of folliculitis is a small, white or yellow, pus-filled, itchy lesion (pustule). These lesions appear on a red, inflamed follicle. They are usually less than 0.2 inches (5 mm) wide. When there is an infection of the follicle that goes deeper, it becomes a boil or furuncle. A group of closely packed boils creates a larger lesion (carbuncle). Carbuncles tend to occur in hairy, sweaty areas of the body. DIAGNOSIS  Your caregiver can usually tell what is wrong by doing a physical exam. A sample may be taken from one of the lesions and tested in a lab. This can help determine what is causing your folliculitis. TREATMENT  Treatment may include:  Applying warm compresses to the affected areas.  Taking antibiotic medicines orally or applying them to the skin.  Draining the lesions if they contain a large amount of pus or fluid.  Laser hair removal for cases of long-lasting folliculitis. This helps to prevent regrowth of the hair. HOME CARE INSTRUCTIONS  Apply warm compresses to the affected areas as directed by your caregiver.  If antibiotics are prescribed, take them as directed. Finish them even if you start to feel better.  You may take over-the-counter medicines to relieve itching.  Do not  shave irritated skin.  Follow up with your caregiver as directed. SEEK IMMEDIATE MEDICAL CARE IF:   You have increasing redness, swelling, or pain in the affected area.  You have a fever. MAKE SURE YOU:  Understand these instructions.  Will watch your condition.  Will get help right away if you are not doing well or get worse.   This information is not intended to replace advice given to you by your health care provider. Make sure you discuss any questions you have with your health care provider.   Document Released: 03/08/2001 Document Revised: 01/18/2014 Document Reviewed: 03/30/2011 Elsevier Interactive Patient Education 2016 Elsevier Inc.  

## 2015-10-02 NOTE — Addendum Note (Signed)
Addended by: Berna SpareASTILLO, BLANCA A on: 10/02/2015 11:23 AM   Modules accepted: Orders

## 2015-12-15 ENCOUNTER — Other Ambulatory Visit: Payer: Self-pay | Admitting: Gynecology

## 2015-12-15 NOTE — Telephone Encounter (Signed)
Called into pharmacy

## 2016-03-12 ENCOUNTER — Other Ambulatory Visit: Payer: Self-pay | Admitting: Gynecology

## 2016-03-12 DIAGNOSIS — Z1231 Encounter for screening mammogram for malignant neoplasm of breast: Secondary | ICD-10-CM

## 2016-03-31 ENCOUNTER — Ambulatory Visit
Admission: RE | Admit: 2016-03-31 | Discharge: 2016-03-31 | Disposition: A | Discharge status: Home / Self Care | Payer: BC Managed Care – PPO | Source: Ambulatory Visit | Attending: Gynecology | Admitting: Gynecology

## 2016-03-31 DIAGNOSIS — Z1231 Encounter for screening mammogram for malignant neoplasm of breast: Secondary | ICD-10-CM

## 2016-04-01 ENCOUNTER — Other Ambulatory Visit: Payer: Self-pay | Admitting: Gynecology

## 2016-04-01 DIAGNOSIS — R928 Other abnormal and inconclusive findings on diagnostic imaging of breast: Secondary | ICD-10-CM

## 2016-04-06 ENCOUNTER — Other Ambulatory Visit: Payer: BC Managed Care – PPO

## 2016-04-07 ENCOUNTER — Ambulatory Visit
Admission: RE | Admit: 2016-04-07 | Discharge: 2016-04-07 | Disposition: A | Discharge status: Home / Self Care | Payer: BC Managed Care – PPO | Source: Ambulatory Visit | Attending: Gynecology | Admitting: Gynecology

## 2016-04-07 DIAGNOSIS — R928 Other abnormal and inconclusive findings on diagnostic imaging of breast: Secondary | ICD-10-CM

## 2016-05-17 ENCOUNTER — Other Ambulatory Visit: Payer: Self-pay | Admitting: Gynecology

## 2016-05-18 NOTE — Telephone Encounter (Signed)
She is overdue for annual gyn exam

## 2016-05-18 NOTE — Telephone Encounter (Signed)
Called into pharmacy

## 2016-05-26 ENCOUNTER — Encounter: Payer: Self-pay | Admitting: Gynecology

## 2017-06-28 ENCOUNTER — Other Ambulatory Visit: Payer: Self-pay | Admitting: Physician Assistant

## 2017-06-28 DIAGNOSIS — M5412 Radiculopathy, cervical region: Secondary | ICD-10-CM

## 2017-07-04 ENCOUNTER — Ambulatory Visit
Admission: RE | Admit: 2017-07-04 | Discharge: 2017-07-04 | Disposition: A | Discharge status: Home / Self Care | Payer: BC Managed Care – PPO | Source: Ambulatory Visit | Attending: Physician Assistant | Admitting: Physician Assistant

## 2017-07-04 ENCOUNTER — Inpatient Hospital Stay
Admission: RE | Admit: 2017-07-04 | Discharge: 2017-07-04 | Disposition: A | Discharge status: Home / Self Care | Payer: BC Managed Care – PPO | Source: Ambulatory Visit | Attending: Physician Assistant | Admitting: Physician Assistant

## 2017-07-04 DIAGNOSIS — M5412 Radiculopathy, cervical region: Secondary | ICD-10-CM

## 2017-07-04 MED ORDER — GADOBENATE DIMEGLUMINE 529 MG/ML IV SOLN
13.0000 mL | Freq: Once | INTRAVENOUS | Status: AC | PRN
Start: 2017-07-04 — End: 2017-07-04
  Administered 2017-07-04: 13 mL via INTRAVENOUS

## 2017-07-29 ENCOUNTER — Other Ambulatory Visit: Payer: Self-pay | Admitting: Neurosurgery

## 2017-07-29 DIAGNOSIS — M50223 Other cervical disc displacement at C6-C7 level: Secondary | ICD-10-CM

## 2017-08-11 ENCOUNTER — Ambulatory Visit
Admission: RE | Admit: 2017-08-11 | Discharge: 2017-08-11 | Disposition: A | Discharge status: Home / Self Care | Payer: BC Managed Care – PPO | Source: Ambulatory Visit | Attending: Neurosurgery | Admitting: Neurosurgery

## 2017-08-11 DIAGNOSIS — M50223 Other cervical disc displacement at C6-C7 level: Secondary | ICD-10-CM

## 2017-08-11 MED ORDER — IOPAMIDOL (ISOVUE-M 300) INJECTION 61%
10.0000 mL | Freq: Once | INTRAMUSCULAR | Status: AC | PRN
  Administered 2017-08-11: 10 mL via INTRATHECAL

## 2017-08-11 MED ORDER — ONDANSETRON HCL 4 MG/2ML IJ SOLN: 4.0000 mg | Freq: Four times a day (QID) | INTRAMUSCULAR | Status: DC | PRN

## 2017-08-11 MED ORDER — ONDANSETRON HCL 4 MG/2ML IJ SOLN
4.0000 mg | Freq: Once | INTRAMUSCULAR | Status: AC
  Administered 2017-08-11: 4 mg via INTRAMUSCULAR

## 2017-08-11 MED ORDER — MEPERIDINE HCL 50 MG/ML IJ SOLN
50.0000 mg | Freq: Once | INTRAMUSCULAR | Status: AC
  Administered 2017-08-11: 50 mg via INTRAMUSCULAR

## 2017-08-11 MED ORDER — DIAZEPAM 5 MG PO TABS
5.0000 mg | ORAL_TABLET | Freq: Once | ORAL | Status: AC
  Administered 2017-08-11: 5 mg via ORAL

## 2017-08-11 NOTE — Discharge Instructions (Signed)

## 2017-08-12 ENCOUNTER — Telehealth: Payer: Self-pay

## 2017-08-12 NOTE — Telephone Encounter (Signed)
Patient called to report positional headache ("really bad") following her 24 hours of bedrest after her myelogram here yesterday at 0930.  Patient knows to lie back down for at least another 24 hours of strict bedrest and to drink extra fluids, especially caffeinated one if she can tolerate them.  Donell SievertJeanne Maanvi Lecompte, RN

## 2017-08-16 ENCOUNTER — Other Ambulatory Visit: Payer: BC Managed Care – PPO

## 2017-08-16 ENCOUNTER — Other Ambulatory Visit: Payer: Self-pay | Admitting: Neurosurgery

## 2017-08-16 DIAGNOSIS — G971 Other reaction to spinal and lumbar puncture: Secondary | ICD-10-CM

## 2017-08-17 ENCOUNTER — Inpatient Hospital Stay
Admission: RE | Admit: 2017-08-17 | Discharge: 2017-08-17 | Disposition: A | Discharge status: Home / Self Care | Payer: BC Managed Care – PPO | Source: Ambulatory Visit | Attending: Neurosurgery | Admitting: Neurosurgery

## 2019-03-17 ENCOUNTER — Ambulatory Visit: Payer: BC Managed Care – PPO | Attending: Internal Medicine

## 2019-03-17 DIAGNOSIS — Z23 Encounter for immunization: Secondary | ICD-10-CM | POA: Insufficient documentation

## 2019-03-17 NOTE — Progress Notes (Signed)
Lori Mercado rec'd her COVID vaccine at 66. During her observation period, she called RN to her side.  1229 Reports chest pain, labored breathing. Hx SLE. 100% on RA, P 89, 123/73, RR24. 2 RNs at pt's chair.   1232 P 79. Describes "Feels like I need to swallow" but not that her throat is itching.  1235 Reports she doesn't yet feel better. 113/75, P 81, 100% on RA, still w chest tightness. Gave her a bottle of cold water. She did eat before she came here.  1240 Lori Mercado reports she feels back to her normal & is ready to leave. Her husband is in the car and is driving. We reviewed when to come to the ED/call 911, and she agreed to do either if symptoms return or worsen. Lori Mercado was able to walk out on her own.   Margret Chance Kendy Haston, RN, BSN, [facility] 03/17/2019 1:00 PM

## 2019-04-07 ENCOUNTER — Ambulatory Visit: Payer: BC Managed Care – PPO | Attending: Internal Medicine

## 2019-04-07 DIAGNOSIS — Z23 Encounter for immunization: Secondary | ICD-10-CM

## 2019-04-07 NOTE — Progress Notes (Signed)
   Covid-19 Vaccination Clinic  Name:  MAIRIN LINDSLEY    MRN: [id number] DOB: 1975-11-14  04/07/2019  Ms. Fussell was observed post Covid-19 immunization for 30 minutes based on pre-vaccination screening without incident. She was provided with Vaccine Information Sheet and instruction to access the V-Safe system.   Ms. Piggott was instructed to call 911 with any severe reactions post vaccine: Marland Kitchen Difficulty breathing  . Swelling of face and throat  . A fast heartbeat  . A bad rash all over body  . Dizziness and weakness   Immunizations Administered    Name Date Dose VIS Date Route   Pfizer COVID-19 Vaccine 04/07/2019 12:17 PM 0.3 mL 12/22/2018 Intramuscular   Manufacturer: [employer], [employer]   Lot: [id number]   NDC: [address]-5

## 2020-07-03 ENCOUNTER — Other Ambulatory Visit (HOSPITAL_BASED_OUTPATIENT_CLINIC_OR_DEPARTMENT_OTHER): Payer: Self-pay | Admitting: Geriatric Medicine

## 2020-07-03 ENCOUNTER — Ambulatory Visit (HOSPITAL_BASED_OUTPATIENT_CLINIC_OR_DEPARTMENT_OTHER)
Admission: RE | Admit: 2020-07-03 | Discharge: 2020-07-03 | Disposition: A | Discharge status: Home / Self Care | Payer: BC Managed Care – PPO | Source: Ambulatory Visit | Attending: Geriatric Medicine | Admitting: Geriatric Medicine

## 2020-07-03 ENCOUNTER — Other Ambulatory Visit: Payer: Self-pay

## 2020-07-03 DIAGNOSIS — M79662 Pain in left lower leg: Secondary | ICD-10-CM | POA: Insufficient documentation

## 2020-07-04 ENCOUNTER — Ambulatory Visit (HOSPITAL_BASED_OUTPATIENT_CLINIC_OR_DEPARTMENT_OTHER): Payer: BC Managed Care – PPO

## 2021-05-22 ENCOUNTER — Other Ambulatory Visit: Payer: Self-pay | Admitting: Internal Medicine

## 2021-05-22 ENCOUNTER — Ambulatory Visit
Admission: RE | Admit: 2021-05-22 | Discharge: 2021-05-22 | Disposition: A | Discharge status: Home / Self Care | Payer: BC Managed Care – PPO | Source: Ambulatory Visit | Attending: Internal Medicine | Admitting: Internal Medicine

## 2021-05-22 DIAGNOSIS — R109 Unspecified abdominal pain: Secondary | ICD-10-CM

## 2021-06-25 ENCOUNTER — Other Ambulatory Visit: Payer: Self-pay | Admitting: Obstetrics and Gynecology

## 2021-06-25 DIAGNOSIS — Z1231 Encounter for screening mammogram for malignant neoplasm of breast: Secondary | ICD-10-CM

## 2021-07-03 ENCOUNTER — Other Ambulatory Visit (INDEPENDENT_AMBULATORY_CARE_PROVIDER_SITE_OTHER): Payer: BC Managed Care – PPO

## 2021-07-03 ENCOUNTER — Ambulatory Visit: Payer: BC Managed Care – PPO | Admitting: Physician Assistant

## 2021-07-03 ENCOUNTER — Ambulatory Visit
Admission: RE | Admit: 2021-07-03 | Discharge: 2021-07-03 | Disposition: A | Discharge status: Home / Self Care | Payer: BC Managed Care – PPO | Source: Ambulatory Visit | Attending: Obstetrics and Gynecology | Admitting: Obstetrics and Gynecology

## 2021-07-03 ENCOUNTER — Encounter: Payer: Self-pay | Admitting: Physician Assistant

## 2021-07-03 VITALS — BP 110/80 | HR 86 | Ht 63.0 in | Wt 149.0 lb

## 2021-07-03 DIAGNOSIS — R143 Flatulence: Secondary | ICD-10-CM

## 2021-07-03 DIAGNOSIS — R1030 Lower abdominal pain, unspecified: Secondary | ICD-10-CM | POA: Diagnosis not present

## 2021-07-03 DIAGNOSIS — Z8719 Personal history of other diseases of the digestive system: Secondary | ICD-10-CM | POA: Diagnosis not present

## 2021-07-03 DIAGNOSIS — R197 Diarrhea, unspecified: Secondary | ICD-10-CM

## 2021-07-03 DIAGNOSIS — Z1231 Encounter for screening mammogram for malignant neoplasm of breast: Secondary | ICD-10-CM

## 2021-07-03 DIAGNOSIS — R140 Abdominal distension (gaseous): Secondary | ICD-10-CM

## 2021-07-03 DIAGNOSIS — R109 Unspecified abdominal pain: Secondary | ICD-10-CM

## 2021-07-03 DIAGNOSIS — R194 Change in bowel habit: Secondary | ICD-10-CM

## 2021-07-03 LAB — HIGH SENSITIVITY CRP: CRP, High Sensitivity: 1.29 mg/L (ref 0.000–5.000)

## 2021-07-03 LAB — SEDIMENTATION RATE: Sed Rate: 20 mm/hr (ref 0–20)

## 2021-07-03 MED ORDER — GLYCOPYRROLATE 2 MG PO TABS: 2.0000 mg | ORAL_TABLET | Freq: Two times a day (BID) | ORAL | 4 refills | Status: AC | PRN

## 2021-07-03 NOTE — Progress Notes (Addendum)
Subjective:    Patient ID: Lori Mercado, female    DOB: 1975/03/08, 46 y.o.   MRN: [id number]  HPI Lori Mercado is a 46 year old female, new to GI today referred by Dr. Nehemiah Settle for evaluation of abdominal pain.  Patient is generally in good health with previously diagnosed IBS, and anxiety. She relates that she did have prior colonoscopy and EGD in 2015 in [place] but does not recall who did the procedures.  She does not recall any specific findings other than being told that her symptoms were consistent with IBS.  She has not been on any treatment for IBS symptoms. She feels that over the past 6 to 12 months she has had worsening symptoms with lower abdominal pain/discomfort which she describes as a dull ache which may be present for several days at a time.  She has had alternating bowel habits between constipation and diarrhea depending on her intake.  She says that eating spicy or saucy foods will tend to aggravate diarrhea, and she tries to eat very bland and dry foods if she is eating out in order to avoid these episodes. Over the past month her symptoms have not been as bad, she has not done anything specific different in the interim.  She does have lactose intolerance and generally avoids milk entirely and ice cream but does eat some cheese and uses a small amount of creamer in her coffee.  She also complains of bloating and gassiness. She does not feel that stress exacerbates her symptoms at this point. Family history is negative for GI disease other than polyps in her father.  Review of Systems. Pertinent positive and negative review of systems were noted in the above HPI section.  All other review of systems was otherwise negative.   Outpatient Encounter Medications as of 07/03/2021  Medication Sig   acetaminophen (TYLENOL) 500 MG tablet Take 1,000 mg by mouth every 6 (six) hours as needed.   albuterol (VENTOLIN HFA) 108 (90 Base) MCG/ACT inhaler albuterol sulfate HFA 90 mcg/actuation  aerosol inhaler  INHALE 1 PUFF INTO THE LUNGS EVERY 4 HOURS FOR 30 DAYS   ALPRAZolam (XANAX) 0.25 MG tablet TAKE 1 TABLET BY MOUTH DAILY AS NEEDED FOR ANXIETY   glycopyrrolate (ROBINUL) 2 MG tablet Take 1 tablet (2 mg total) by mouth 2 (two) times daily as needed. For abdominal pain/cramping   [DISCONTINUED] methocarbamol (ROBAXIN) 750 MG tablet Take 750 mg by mouth every 6 (six) hours.   No facility-administered encounter medications on file as of 07/03/2021.   Allergies  Allergen Reactions   Fruit & Vegetable Daily [Nutritional Supplements] Anaphylaxis    Allergic to most fruits   Other Anaphylaxis    Tree nuts   Aspirin Other (See Comments)    "Asthma"   Patient Active Problem List   Diagnosis Date Noted   Anxiety 05/23/2015   H/O vitamin D deficiency    History of IBS 11/26/2013   Disturbance of skin sensation 01/14/2012   Social History   Socioeconomic History   Marital status: Married    Spouse name: Not on file   Number of children: 2   Years of education: College   Highest education level: Not on file  Occupational History    Employer: [employer]  Tobacco Use   Smoking status: Never   Smokeless tobacco: Not on file  Vaping Use   Vaping Use: Never used  Substance and Sexual Activity   Alcohol use: Yes    Alcohol/week: 0.0 standard drinks  of alcohol    Comment: Drinks wine on occasion   Drug use: No   Sexual activity: Yes    Comment: patient's husband with vasectomy  Other Topics Concern   Not on file  Social History Narrative   Not on file   Social Determinants of Health   Financial Resource Strain: Not on file  Food Insecurity: Not on file  Transportation Needs: Not on file  Physical Activity: Not on file  Stress: Not on file  Social Connections: Not on file  Intimate Partner Violence: Not on file    Lori Mercado's family history includes Arthritis in her mother; Diabetes in her paternal grandfather and paternal grandmother; Heart attack  in her maternal grandfather; Hypertension in her father; Lupus in her cousin; Multiple sclerosis in her maternal aunt; Osteoarthritis in her maternal grandmother; Stroke in her paternal grandfather and paternal grandmother.      Objective:    Vitals:   07/03/21 0837  BP: 110/80  Pulse: 86    Physical Exam Well-developed well-nourished female in no acute distress.  Height, Weight, 149  BMI26.3  HEENT; nontraumatic normocephalic, EOMI, PE R LA, sclera anicteric. Oropharynx;not examined Neck; supple, no JVD Cardiovascular; regular rate and rhythm with S1-S2, no murmur rub or gallop Pulmonary; Clear bilaterally Abdomen; soft, there is some mild tenderness across the lower abdomen, more prominent in the left lower quadrant and left mid quadrant no guarding or rebound nondistended, no palpable mass or hepatosplenomegaly, bowel sounds are active Rectal; not done today Skin; benign exam, no jaundice rash or appreciable lesions Extremities; no clubbing cyanosis or edema skin warm and dry Neuro/Psych; alert and oriented x4, grossly nonfocal mood and affect appropriate        Assessment & Plan:   #83 46 year old white female with previous diagnosis of IBS, with 6 to 75-month history of complaints of lower abdominal pain described as dull and aching and sometimes present for days at a time, this can be associated with bloating, gas and alternating bowel habits. Occasional episodic diarrhea usually exacerbated by spicy or salty foods.  I suspect her symptoms are secondary to IBS, she is also lactose intolerant and continues to consume small amounts of lactose which may be contributing. Rule out SIBO, rule out celiac disease With tenderness primarily in the left lower quadrant left mid quadrant on exam, consider occult lesion, symptomatic diverticulosis, spasm associated with IBS.  #2 colon cancer screening-1 prior colonoscopy 2014 or 2015 done through [facility] or Dr. Nicholes Mango results not  known  #3 anxiety  Plan; patient will sign a release and will request copies of prior EGD and colonoscopy [facility] versus Drs. Nicholes Mango and any path  Check sed rate, CRP, TTG and IgA We will proceed with breath testing to assess for SIBO Patient will be scheduled for colonoscopy with Dr. Christella Hartigan.  Procedure was discussed in detail with the patient including indications risk and benefits and she is agreeable to proceed. Have  asked her to start Lactaid tablets with any Lactaid consumption. Start trial of Robinul Forte 2 mg p.o. daily to twice daily as needed Add Benefiber 1 dose daily in a glass of water Further recommendations pending results of above.   Addendum; records received from [facility].  Patient had colonoscopy with Dr. Evette Cristal December 2015.  This was a normal exam, with good prep.  Sigmoid biopsy was unremarkable no microscopic colitis or active inflammation, random colon biopsies unremarkable no microscopic colitis or active inflammation. EGD done at that same  time normal other than mild erythema at the GE junction.  Gastric biopsy benign antral mucosa with minimal chronic inflammation no H. Pylori Records will be scanned  Sammuel Cooper PA-C 07/03/2021   Cc: Renford Dills, MD

## 2021-07-06 LAB — IGA: Immunoglobulin A: 280 mg/dL (ref 47–310)

## 2021-07-06 LAB — TISSUE TRANSGLUTAMINASE, IGA: (tTG) Ab, IgA: <1 U/mL

## 2021-07-09 ENCOUNTER — Encounter: Payer: Self-pay | Admitting: Gastroenterology

## 2021-07-17 ENCOUNTER — Ambulatory Visit (AMBULATORY_SURGERY_CENTER): Payer: BC Managed Care – PPO | Admitting: Gastroenterology

## 2021-07-17 ENCOUNTER — Encounter: Payer: Self-pay | Admitting: Gastroenterology

## 2021-07-17 VITALS — BP 107/77 | HR 68 | Temp 98.2°F | Resp 15 | Ht 63.0 in | Wt 149.0 lb

## 2021-07-17 DIAGNOSIS — R194 Change in bowel habit: Secondary | ICD-10-CM | POA: Diagnosis not present

## 2021-07-17 DIAGNOSIS — K529 Noninfective gastroenteritis and colitis, unspecified: Secondary | ICD-10-CM

## 2021-07-17 MED ORDER — SODIUM CHLORIDE 0.9 % IV SOLN: 500.0000 mL | INTRAVENOUS | Status: DC

## 2021-07-17 NOTE — Progress Notes (Signed)
  The recent H&P (dated 07/03/21) was reviewed, the patient was examined and there is no change in the patients condition since that H&P was completed.   Rachael Fee  07/17/2021, 2:38 PM

## 2021-07-17 NOTE — Op Note (Signed)
[facility] Patient Name: Lori Mercado Procedure Date: 07/17/2021 2:43 PM MRN: [id number] Endoscopist: Rachael Fee , MD Age: 46 Referring MD:  Date of Birth: 04-19-1975 Gender: Female Account #: [id number] Procedure:                Colonoscopy Indications:              chronic alternating bowel habits, lower abd pains Medicines:                Monitored Anesthesia Care Procedure:                Pre-Anesthesia Assessment:                           - Prior to the procedure, a History and Physical                            was performed, and patient medications and                            allergies were reviewed. The patient's tolerance of                            previous anesthesia was also reviewed. The risks                            and benefits of the procedure and the sedation                            options and risks were discussed with the patient.                            All questions were answered, and informed consent                            was obtained. Prior Anticoagulants: The patient has                            taken no previous anticoagulant or antiplatelet                            agents. ASA Grade Assessment: II - A patient with                            mild systemic disease. After reviewing the risks                            and benefits, the patient was deemed in                            satisfactory condition to undergo the procedure.                           After obtaining informed consent, the colonoscope  was passed under direct vision. Throughout the                            procedure, the patient's blood pressure, pulse, and                            oxygen saturations were monitored continuously. The                            PCF-HQ190L Colonoscope was introduced through the                            anus and advanced to the the terminal ileum. The                            colonoscopy  was performed without difficulty. The                            patient tolerated the procedure well. The quality                            of the bowel preparation was good. The terminal                            ileum, ileocecal valve, appendiceal orifice, and                            rectum were photographed. Scope In: 2:48:29 PM Scope Out: 2:59:41 PM Scope Withdrawal Time: 0 hours 8 minutes 11 seconds  Total Procedure Duration: 0 hours 11 minutes 12 seconds  Findings:                 The terminal ileum appeared normal.                           The entire examined colon appeared normal.                           Biopsies for histology were taken with a cold                            forceps from the entire colon for evaluation of                            microscopic colitis.                           The exam was otherwise without abnormality on                            direct and retroflexion views. Complications:            No immediate complications. Estimated blood loss:                            None. Estimated  Blood Loss:     Estimated blood loss: none. Impression:               - The examined portion of the ileum was normal.                           - The entire examined colon is normal.                           - The examination was otherwise normal on direct                            and retroflexion views.                           - Biopsies were taken with a cold forceps from the                            entire colon for evaluation of microscopic colitis. Recommendation:           - Patient has a contact number available for                            emergencies. The signs and symptoms of potential                            delayed complications were discussed with the                            patient. Return to normal activities tomorrow.                            Written discharge instructions were provided to the                             patient.                           - Resume previous diet.                           - Continue present medications.                           - Await pathology results.                           - Please retry fiber supplement (powder citrucel or                            metamucil) every day and call in 5-6 weeks to                            report on your response. Milus Banister, MD 07/17/2021 3:03:05 PM This report has been signed electronically.

## 2021-07-17 NOTE — Patient Instructions (Signed)
Resume previous diet.  Continue current medications.  Await pathology results.    Please retry fiber supplement (powder citrucel or metamucil) every day and call in 5-6 weeks to report your response.   YOU HAD AN ENDOSCOPIC PROCEDURE TODAY AT THE [facility]:   Refer to the procedure report that was given to you for any specific questions about what was found during the examination.  If the procedure report does not answer your questions, please call your gastroenterologist to clarify.  If you requested that your care partner not be given the details of your procedure findings, then the procedure report has been included in a sealed envelope for you to review at your convenience later.  YOU SHOULD EXPECT: Some feelings of bloating in the abdomen. Passage of more gas than usual.  Walking can help get rid of the air that was put into your GI tract during the procedure and reduce the bloating. If you had a lower endoscopy (such as a colonoscopy or flexible sigmoidoscopy) you may notice spotting of blood in your stool or on the toilet paper. If you underwent a bowel prep for your procedure, you may not have a normal bowel movement for a few days.  Please Note:  You might notice some irritation and congestion in your nose or some drainage.  This is from the oxygen used during your procedure.  There is no need for concern and it should clear up in a day or so.  SYMPTOMS TO REPORT IMMEDIATELY:  Following lower endoscopy (colonoscopy or flexible sigmoidoscopy):  Excessive amounts of blood in the stool  Significant tenderness or worsening of abdominal pains  Swelling of the abdomen that is new, acute  Fever of 100F or higher  For urgent or emergent issues, a gastroenterologist can be reached at any hour by calling [phone]. Do not use MyChart messaging for urgent concerns.    DIET:  We do recommend a small meal at first, but then you may proceed to your regular diet.  Drink plenty of  fluids but you should avoid alcoholic beverages for 24 hours.  ACTIVITY:  You should plan to take it easy for the rest of today and you should NOT DRIVE or use heavy machinery until tomorrow (because of the sedation medicines used during the test).    FOLLOW UP: Our staff will call the number listed on your records the next business day following your procedure.  We will call around 7:15- 8:00 am to check on you and address any questions or concerns that you may have regarding the information given to you following your procedure. If we do not reach you, we will leave a message.  If you develop any symptoms (ie: fever, flu-like symptoms, shortness of breath, cough etc.) before then, please call [phone].  If you test positive for Covid 19 in the 2 weeks post procedure, please call and report this information to [place].    If any biopsies were taken you will be contacted by phone or by letter within the next 1-3 weeks.  Please call us at [phone] if you have not heard about the biopsies in 3 weeks.    SIGNATURES/CONFIDENTIALITY: You and/or your care partner have signed paperwork which will be entered into your electronic medical record.  These signatures attest to the fact that that the information above on your After Visit Summary has been reviewed and is understood.  Full responsibility of the confidentiality of this discharge information lies with you and/or your  care-partner.  

## 2021-07-17 NOTE — Progress Notes (Signed)
A and O x3. Report to RN. Tolerated MAC anesthesia well. 

## 2021-07-17 NOTE — Progress Notes (Signed)
Called to room to assist during endoscopic procedure.  Patient ID and intended procedure confirmed with present staff. Received instructions for my participation in the procedure from the performing physician.  

## 2021-07-23 ENCOUNTER — Encounter: Payer: Self-pay | Admitting: Gastroenterology

## 2021-09-21 ENCOUNTER — Encounter: Payer: Self-pay | Admitting: *Deleted

## 2021-09-21 ENCOUNTER — Ambulatory Visit: Payer: BC Managed Care – PPO | Admitting: Psychiatry

## 2021-09-21 VITALS — BP 106/73 | HR 66 | Ht 63.0 in | Wt 149.8 lb

## 2021-09-21 DIAGNOSIS — R519 Headache, unspecified: Secondary | ICD-10-CM

## 2021-09-21 DIAGNOSIS — G43109 Migraine with aura, not intractable, without status migrainosus: Secondary | ICD-10-CM | POA: Diagnosis not present

## 2021-09-21 DIAGNOSIS — R29818 Other symptoms and signs involving the nervous system: Secondary | ICD-10-CM

## 2021-09-21 MED ORDER — RIZATRIPTAN BENZOATE 10 MG PO TABS: 10.0000 mg | ORAL_TABLET | ORAL | 6 refills | Status: AC | PRN

## 2021-09-21 NOTE — Progress Notes (Signed)
Per [facility], regarding SIBO testing, they reached out to patient on 08/04/21. She has not returned their phone calls.

## 2021-09-21 NOTE — Patient Instructions (Signed)
Plan:  -Brain MRI -Start rizatriptan as needed for migraine. Take at the onset of migraine. If headache recurs or does not fully resolve, you may take a second dose after 2 hours. Please avoid taking more than 2 days per week  to avoid rebound headaches

## 2021-09-21 NOTE — Progress Notes (Signed)
Referring:  Renford Dills, MD [address] [place],  [place] [address]  PCP: Renford Dills, MD  Neurology was asked to evaluate Lori Mercado, a 46 year old female for a chief complaint of headaches.  Our recommendations of care will be communicated by shared medical record.    CC:  headaches  History provided from self  HPI:  Medical co-morbidities: SLE, Sjogrens, cervical stenosis s/p C5-6 ACDF  The patient presents for evaluation of daily headaches which began 2 months ago. She has had only intermittent headaches prior to this. No clear trigger for onset of headaches. Headaches are described as unilateral throbbing pain with photophobia. She has had 2 episodes of vision loss associated with her headaches. Lost vision in her right eye for 2-3 minutes, then developed a severe headache. She has also rarely had numbness in her left face and speech difficulty with her headache.  She was prescribed Topamax by her PCP but did not take it. She does not want to take a daily medication if possible.  She was evaluated by Neurology in 2014 for similar symptoms and this was suspected to be functional. She was also evaluated with brain MRI in 2017 for blurry vision. This showed a stable T2 hyperintensity in the right centrum semi-ovale and was otherwise unremarkable.  Headache History: Onset: 2 months ago Triggers: bright lights Aura: vision loss in right eye Location: moves around Quality/Description: throbbing Associated Symptoms:  Photophobia: yes  Phonophobia: no  Nausea: no Worse with activity?: yes Duration of headaches: 2-3 hours  Headache days per month: 21 Headache free days per month: 9  Current Treatment: Abortive Tylenol  Preventative none  Prior Therapies                                 Tylenol   LABS: CBC    Component Value Date/Time   WBC 5.2 06/25/2015 2354   RBC 4.70 06/25/2015 2354   HGB 13.6 06/26/2015 0001   HCT 40.0 06/26/2015 0001    PLT 190 06/25/2015 2354   MCV 84.0 06/25/2015 2354   MCH 26.6 06/25/2015 2354   MCHC 31.6 06/25/2015 2354   RDW 13.9 06/25/2015 2354   LYMPHSABS 1.7 06/25/2015 2354   MONOABS 0.5 06/25/2015 2354   EOSABS 0.2 06/25/2015 2354   BASOSABS 0.0 06/25/2015 2354      Latest Ref Rng & Units 06/26/2015   12:01 AM 06/25/2015   11:54 PM 06/13/2015    8:33 AM  CMP  Glucose 65 - 99 mg/dL 836  629  85   BUN 6 - 20 mg/dL 12  11  11    Creatinine 0.44 - 1.00 mg/dL  4.76  5.46   Sodium 135 - 145 mmol/L 141  137  140   Potassium 3.5 - 5.1 mmol/L 3.3  3.2  3.8   Chloride 101 - 111 mmol/L 105  108  107   CO2 22 - 32 mmol/L  24  24   Calcium 8.9 - 10.3 mg/dL  9.0  8.8   Total Protein 6.5 - 8.1 g/dL  6.4  6.2   Total Bilirubin 0.3 - 1.2 mg/dL  0.7  0.7   Alkaline Phos 38 - 126 U/L  57  58   AST 15 - 41 U/L  17  17   ALT 14 - 54 U/L  14  15      IMAGING:  MRI brain 06/26/15:  Essentially normal brain MRI. No acute intracranial abnormality identified. No change since 09/11/2012. Again noted is a solitary T2/FLAIR hyperintensity within right centrum semi ovale, nonspecific, but may be related to chronic microvascular ischemic disease or underlying migrainous disorder. While underlying demyelinating disease is possible, this is felt to be unlikely given the stability of this lesion and lack of additional lesion since previous exam.  Imaging independently reviewed on September 21, 2021   Current Outpatient Medications on File Prior to Visit  Medication Sig Dispense Refill   acetaminophen (TYLENOL) 500 MG tablet Take 1,000 mg by mouth every 6 (six) hours as needed.     albuterol (VENTOLIN HFA) 108 (90 Base) MCG/ACT inhaler albuterol sulfate HFA 90 mcg/actuation aerosol inhaler  INHALE 1 PUFF INTO THE LUNGS EVERY 4 HOURS FOR 30 DAYS     ALPRAZolam (XANAX) 0.25 MG tablet TAKE 1 TABLET BY MOUTH DAILY AS NEEDED FOR ANXIETY 30 tablet 0   glycopyrrolate (ROBINUL) 2 MG tablet Take 1 tablet (2 mg  total) by mouth 2 (two) times daily as needed. For abdominal pain/cramping 60 tablet 4   meloxicam (MOBIC) 7.5 MG tablet Take 7.5 mg by mouth daily.     No current facility-administered medications on file prior to visit.     Allergies: Allergies  Allergen Reactions   Fruit & Vegetable Daily [Nutritional Supplements] Anaphylaxis    Allergic to most fruits   Other Anaphylaxis    Tree nuts   Aspirin Other (See Comments)    "Asthma"    Family History: Migraine or other headaches in the family:  no Aneurysms in a first degree relative:  no Brain tumors in the family:  no Other neurological illness in the family:   no  Past Medical History: Past Medical History:  Diagnosis Date   Asthma    Cervical disc disorder    C5-6   Connective tissue disorder (HCC)    Poorly defined connective tissue disorder   H/O vitamin D deficiency    IBS (irritable bowel syndrome)    Migraine    Reflux     Past Surgical History Past Surgical History:  Procedure Laterality Date   CESAREAN SECTION  01/12/2003   twins   ENDOMETRIAL ABLATION  06/12/2010   her option   SHOULDER SURGERY Left    SPINE SURGERY      Social History: Social History   Tobacco Use   Smoking status: Never   Smokeless tobacco: Never  Vaping Use   Vaping Use: Never used  Substance Use Topics   Alcohol use: Yes    Alcohol/week: 0.0 standard drinks of alcohol    Comment: Drinks wine on occasion   Drug use: No     ROS: Negative for fevers, chills. Positive for headaches, visual disturbance. All other systems reviewed and negative unless stated otherwise in HPI.   Physical Exam:   Vital Signs: BP 106/73   Pulse 66   Ht 5\' 3"  (1.6 m)   Wt 149 lb 12.8 oz (67.9 kg)   BMI 26.54 kg/m  GENERAL: well appearing,in no acute distress,alert SKIN:  Color, texture, turgor normal. No rashes or lesions HEAD:  Normocephalic/atraumatic. CV:  RRR RESP: Normal respiratory effort  NEUROLOGICAL: Mental Status: Alert,  oriented to person, place and time,Follows commands Cranial Nerves: PERRL, visual fields intact to confrontation, extraocular movements intact, facial sensation intact, no facial droop or ptosis, hearing grossly intact, no dysarthria Motor: muscle strength 5/5 both upper and lower extremities Reflexes: 2+ throughout Sensation: intact to light  touch all 4 extremities Coordination: Finger-to- nose-finger intact bilaterally Gait: normal-based   IMPRESSION: 46 year old female with a history of SLE, Sjogrens, cervical stenosis s/p C5-6 ACDF who presents for evaluation of headaches, numbness, and vision loss. Will order brain MRI to assess for structural causes of new daily headaches and vision loss. 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 She would prefer to avoid starting a daily medication. Will start Maxalt for migraine rescue.  PLAN: -MRI brain  -Rescue: Start Maxalt 10 mg PRN   I spent a total of 28 minutes chart reviewing and counseling the patient. Headache education was done. Discussed treatment options including preventive and acute medications. Discussed medication overuse headache and to limit use of acute treatments to no more than 2 days/week or 10 days/month. Discussed medication side effects, adverse reactions and drug interactions. Written educational materials and patient instructions outlining all of the above were given.  Follow-up: 3 months   Ocie Doyne, MD 09/21/2021   1:16 PM

## 2021-12-31 NOTE — Progress Notes (Deleted)
Referring:  Renford Dills, MD [address] [place],  [place] [address]  PCP: Renford Dills, MD  Neurology was asked to evaluate Lori Mercado, a 46 year old female for a chief complaint of headaches.  Our recommendations of care will be communicated by shared medical record.    CC:  headaches  History provided from self  Follow-up visit:  Prior visit: 09/21/2021 with Dr. Delena Bali    Brief HPI:   Lori Mercado is a 46 y.o. female with PMH for SLE, Sjogren's, and cervical stenosis s/p C5-6 ACDF who was seen by Dr. Delena Bali for evaluation of daily headaches that began around 07/2021.  Had 2 episodes of transient visual loss prior to severe headache.  She can also have numbness left-sided facial and speech difficulty with headaches.  Suspected in setting of migraine aura but recommended obtaining MRI brain which has not yet been completed.  Prior MRI in 2017 for blurred vision showed stable T2 hyperintensity of right centrum semiovale, otherwise unremarkable.  PCP previously tried to prescribe topiramate but she wishes to avoid daily preventative medication.  At prior visit, she was started on rizatriptan.     Interval history: Returns for migraine follow-up.    The patient presents for evaluation of daily headaches which began 2 months ago. She has had only intermittent headaches prior to this. No clear trigger for onset of headaches. Headaches are described as unilateral throbbing pain with photophobia. She has had 2 episodes of vision loss associated with her headaches. Lost vision in her right eye for 2-3 minutes, then developed a severe headache. She has also rarely had numbness in her left face and speech difficulty with her headache.    Headache History: Onset: 07/2021 Triggers: bright lights Aura: vision loss in right eye Location: moves around Quality/Description: throbbing Associated Symptoms:  Photophobia: yes  Phonophobia: no  Nausea: no Worse with  activity?: yes Duration of headaches: 2-3 hours  Headache days per month: 21 Headache free days per month: 9  Current Treatment: Abortive Rizatriptan  Preventative none  Prior Therapies                                 Tylenol   LABS: CBC    Component Value Date/Time   WBC 5.2 06/25/2015 2354   RBC 4.70 06/25/2015 2354   HGB 13.6 06/26/2015 0001   HCT 40.0 06/26/2015 0001   PLT 190 06/25/2015 2354   MCV 84.0 06/25/2015 2354   MCH 26.6 06/25/2015 2354   MCHC 31.6 06/25/2015 2354   RDW 13.9 06/25/2015 2354   LYMPHSABS 1.7 06/25/2015 2354   MONOABS 0.5 06/25/2015 2354   EOSABS 0.2 06/25/2015 2354   BASOSABS 0.0 06/25/2015 2354      Latest Ref Rng & Units 06/26/2015   12:01 AM 06/25/2015   11:54 PM 06/13/2015    8:33 AM  CMP  Glucose 65 - 99 mg/dL 128  786  85   BUN 6 - 20 mg/dL 12  11  11    Creatinine 0.44 - 1.00 mg/dL  7.67  2.09   Sodium 135 - 145 mmol/L 141  137  140   Potassium 3.5 - 5.1 mmol/L 3.3  3.2  3.8   Chloride 101 - 111 mmol/L 105  108  107   CO2 22 - 32 mmol/L  24  24   Calcium 8.9 - 10.3 mg/dL  9.0  8.8  Total Protein 6.5 - 8.1 g/dL  6.4  6.2   Total Bilirubin 0.3 - 1.2 mg/dL  0.7  0.7   Alkaline Phos 38 - 126 U/L  57  58   AST 15 - 41 U/L  17  17   ALT 14 - 54 U/L  14  15      IMAGING:  MRI brain 06/26/15: Essentially normal brain MRI. No acute intracranial abnormality identified. No change since 09/11/2012. Again noted is a solitary T2/FLAIR hyperintensity within right centrum semi ovale, nonspecific, but may be related to chronic microvascular ischemic disease or underlying migrainous disorder. While underlying demyelinating disease is possible, this is felt to be unlikely given the stability of this lesion and lack of additional lesion since previous exam.  Imaging independently reviewed on December 31, 2021   Current Outpatient Medications on File Prior to Visit  Medication Sig Dispense Refill   acetaminophen (TYLENOL) 500 MG  tablet Take 1,000 mg by mouth every 6 (six) hours as needed.     albuterol (VENTOLIN HFA) 108 (90 Base) MCG/ACT inhaler albuterol sulfate HFA 90 mcg/actuation aerosol inhaler  INHALE 1 PUFF INTO THE LUNGS EVERY 4 HOURS FOR 30 DAYS     ALPRAZolam (XANAX) 0.25 MG tablet TAKE 1 TABLET BY MOUTH DAILY AS NEEDED FOR ANXIETY 30 tablet 0   glycopyrrolate (ROBINUL) 2 MG tablet Take 1 tablet (2 mg total) by mouth 2 (two) times daily as needed. For abdominal pain/cramping 60 tablet 4   meloxicam (MOBIC) 7.5 MG tablet Take 7.5 mg by mouth daily.     rizatriptan (MAXALT) 10 MG tablet Take 1 tablet (10 mg total) by mouth as needed for migraine. May repeat in 2 hours if needed 10 tablet 6   No current facility-administered medications on file prior to visit.     Allergies: Allergies  Allergen Reactions   Fruit & Vegetable Daily [Nutritional Supplements] Anaphylaxis    Allergic to most fruits   Other Anaphylaxis    Tree nuts   Aspirin Other (See Comments)    "Asthma"    Family History: Migraine or other headaches in the family:  no Aneurysms in a first degree relative:  no Brain tumors in the family:  no Other neurological illness in the family:   no  Past Medical History: Past Medical History:  Diagnosis Date   Asthma    Cervical disc disorder    C5-6   Connective tissue disorder (HCC)    Poorly defined connective tissue disorder   H/O vitamin D deficiency    IBS (irritable bowel syndrome)    Migraine    Reflux     Past Surgical History Past Surgical History:  Procedure Laterality Date   CESAREAN SECTION  01/12/2003   twins   ENDOMETRIAL ABLATION  06/12/2010   her option   SHOULDER SURGERY Left    SPINE SURGERY      Social History: Social History   Tobacco Use   Smoking status: Never   Smokeless tobacco: Never  Vaping Use   Vaping Use: Never used  Substance Use Topics   Alcohol use: Yes    Alcohol/week: 0.0 standard drinks of alcohol    Comment: Drinks wine on  occasion   Drug use: No     ROS: Negative for fevers, chills. Positive for headaches, visual disturbance. All other systems reviewed and negative unless stated otherwise in HPI.   Physical Exam:   Vital Signs: There were no vitals taken for this visit. GENERAL: well appearing,in  no acute distress,alert SKIN:  Color, texture, turgor normal. No rashes or lesions HEAD:  Normocephalic/atraumatic. CV:  RRR RESP: Normal respiratory effort  NEUROLOGICAL: Mental Status: Alert, oriented to person, place and time,Follows commands Cranial Nerves: PERRL, visual fields intact to confrontation, extraocular movements intact, facial sensation intact, no facial droop or ptosis, hearing grossly intact, no dysarthria Motor: muscle strength 5/5 both upper and lower extremities Reflexes: 2+ throughout Sensation: intact to light touch all 4 extremities Coordination: Finger-to- nose-finger intact bilaterally Gait: normal-based   IMPRESSION: 46 year old female with a history of SLE, Sjogrens, cervical stenosis s/p C5-6 ACDF who presents for evaluation of headaches, numbness, and vision loss.  Patient of Dr. Delena Bali.  Has not yet completed MRI brain as previously ordered for new daily headaches and vision loss.  Will order brain MRI to assess for structural causes of new daily headaches and vision loss. 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 She would prefer to avoid starting a daily medication. Will start Maxalt for migraine rescue.  PLAN: -MRI brain  -Rescue: Start Maxalt 10 mg PRN     I spent *** minutes of face-to-face and non-face-to-face time with patient.  This included previsit chart review, lab review, study review, order entry, electronic health record documentation, patient education and discussion regarding the above all other questions to patient satisfaction  Ihor Austin, [facility]  [facility] [address] [place], [place] [address]  Phone [phone] Fax [phone] Note: This document was prepared with digital dictation and possible smart phrase technology. Any transcriptional errors that result from this process are unintentional.

## 2022-01-05 ENCOUNTER — Ambulatory Visit: Payer: BC Managed Care – PPO | Admitting: Adult Health

## 2022-06-02 ENCOUNTER — Other Ambulatory Visit: Payer: Self-pay | Admitting: Obstetrics and Gynecology

## 2022-06-02 DIAGNOSIS — Z1231 Encounter for screening mammogram for malignant neoplasm of breast: Secondary | ICD-10-CM

## 2022-07-08 ENCOUNTER — Ambulatory Visit
Admission: RE | Admit: 2022-07-08 | Discharge: 2022-07-08 | Disposition: A | Discharge status: Home / Self Care | Payer: BC Managed Care – PPO | Source: Ambulatory Visit | Attending: Obstetrics and Gynecology | Admitting: Obstetrics and Gynecology

## 2022-07-08 DIAGNOSIS — Z1231 Encounter for screening mammogram for malignant neoplasm of breast: Secondary | ICD-10-CM

## 2023-04-11 ENCOUNTER — Other Ambulatory Visit: Payer: Self-pay | Admitting: Obstetrics and Gynecology

## 2023-04-11 DIAGNOSIS — Z1231 Encounter for screening mammogram for malignant neoplasm of breast: Secondary | ICD-10-CM

## 2023-07-18 ENCOUNTER — Ambulatory Visit
Admission: RE | Admit: 2023-07-18 | Discharge: 2023-07-18 | Disposition: A | Discharge status: Home / Self Care | Payer: Self-pay | Source: Ambulatory Visit | Attending: Obstetrics and Gynecology | Admitting: Obstetrics and Gynecology

## 2023-07-18 DIAGNOSIS — Z1231 Encounter for screening mammogram for malignant neoplasm of breast: Secondary | ICD-10-CM
